# Patient Record
Sex: Female | Born: 1950 | Race: Black or African American | Hispanic: No | Smoking: Never smoker
Health system: Southern US, Community
[De-identification: ages and names within clinical notes are randomized; demographics above are authoritative.]

## PROBLEM LIST (undated history)

## (undated) DIAGNOSIS — M1990 Unspecified osteoarthritis, unspecified site: Secondary | ICD-10-CM

## (undated) DIAGNOSIS — J189 Pneumonia, unspecified organism: Secondary | ICD-10-CM

## (undated) DIAGNOSIS — R51 Headache: Secondary | ICD-10-CM

## (undated) DIAGNOSIS — I10 Essential (primary) hypertension: Secondary | ICD-10-CM

## (undated) DIAGNOSIS — I890 Lymphedema, not elsewhere classified: Secondary | ICD-10-CM

## (undated) DIAGNOSIS — N390 Urinary tract infection, site not specified: Secondary | ICD-10-CM

## (undated) DIAGNOSIS — S83209A Unspecified tear of unspecified meniscus, current injury, unspecified knee, initial encounter: Secondary | ICD-10-CM

## (undated) DIAGNOSIS — R29898 Other symptoms and signs involving the musculoskeletal system: Secondary | ICD-10-CM

## (undated) DIAGNOSIS — D649 Anemia, unspecified: Secondary | ICD-10-CM

## (undated) HISTORY — PX: TUBAL LIGATION: SHX77

## (undated) HISTORY — PX: OTHER SURGICAL HISTORY: SHX169

---

## 1982-02-16 HISTORY — PX: OTHER SURGICAL HISTORY: SHX169

## 1983-02-17 HISTORY — PX: OTHER SURGICAL HISTORY: SHX169

## 1985-02-16 HISTORY — PX: OTHER SURGICAL HISTORY: SHX169

## 1995-02-17 HISTORY — PX: VAGINAL HYSTERECTOMY: SUR661

## 1997-06-22 ENCOUNTER — Encounter: Admission: RE | Admit: 1997-06-22 | Discharge: 1997-09-20 | Payer: Self-pay | Admitting: Internal Medicine

## 1998-02-16 HISTORY — PX: BILATERAL SALPINGOOPHORECTOMY: SHX1223

## 1998-11-13 ENCOUNTER — Ambulatory Visit: Admission: RE | Admit: 1998-11-13 | Discharge: 1998-11-13 | Payer: Self-pay | Admitting: Gynecologic Oncology

## 1999-01-01 ENCOUNTER — Ambulatory Visit (HOSPITAL_COMMUNITY): Admission: RE | Admit: 1999-01-01 | Discharge: 1999-01-01 | Payer: Self-pay | Admitting: Gynecologic Oncology

## 1999-01-01 ENCOUNTER — Encounter: Payer: Self-pay | Admitting: Gynecologic Oncology

## 1999-06-24 ENCOUNTER — Encounter: Admission: RE | Admit: 1999-06-24 | Discharge: 1999-06-24 | Payer: Self-pay | Admitting: Family Medicine

## 1999-06-24 ENCOUNTER — Encounter: Payer: Self-pay | Admitting: Family Medicine

## 2000-08-10 ENCOUNTER — Encounter: Payer: Self-pay | Admitting: Family Medicine

## 2000-08-10 ENCOUNTER — Encounter: Admission: RE | Admit: 2000-08-10 | Discharge: 2000-08-10 | Payer: Self-pay | Admitting: Family Medicine

## 2003-12-05 ENCOUNTER — Encounter: Admission: RE | Admit: 2003-12-05 | Discharge: 2004-01-02 | Payer: Self-pay | Admitting: Oncology

## 2004-05-07 ENCOUNTER — Ambulatory Visit: Payer: Self-pay | Admitting: Oncology

## 2004-06-17 ENCOUNTER — Ambulatory Visit: Payer: Self-pay | Admitting: Gastroenterology

## 2004-07-01 ENCOUNTER — Ambulatory Visit: Payer: Self-pay | Admitting: Gastroenterology

## 2004-07-01 ENCOUNTER — Encounter (INDEPENDENT_AMBULATORY_CARE_PROVIDER_SITE_OTHER): Payer: Self-pay | Admitting: *Deleted

## 2004-08-29 ENCOUNTER — Ambulatory Visit: Payer: Self-pay | Admitting: Oncology

## 2004-11-12 ENCOUNTER — Ambulatory Visit: Payer: Self-pay | Admitting: Oncology

## 2004-12-01 ENCOUNTER — Ambulatory Visit: Admission: RE | Admit: 2004-12-01 | Discharge: 2004-12-01 | Payer: Self-pay | Admitting: Oncology

## 2005-03-09 ENCOUNTER — Ambulatory Visit: Payer: Self-pay | Admitting: Oncology

## 2005-06-08 ENCOUNTER — Ambulatory Visit: Payer: Self-pay | Admitting: Oncology

## 2006-02-16 HISTORY — PX: OTHER SURGICAL HISTORY: SHX169

## 2006-05-03 ENCOUNTER — Ambulatory Visit: Payer: Self-pay | Admitting: Oncology

## 2007-11-17 HISTORY — PX: INGUINAL HERNIA REPAIR: SUR1180

## 2009-02-18 ENCOUNTER — Ambulatory Visit (HOSPITAL_BASED_OUTPATIENT_CLINIC_OR_DEPARTMENT_OTHER): Admission: RE | Admit: 2009-02-18 | Discharge: 2009-02-18 | Payer: Self-pay | Admitting: Orthopedic Surgery

## 2009-02-18 HISTORY — PX: OTHER SURGICAL HISTORY: SHX169

## 2009-06-24 ENCOUNTER — Encounter (INDEPENDENT_AMBULATORY_CARE_PROVIDER_SITE_OTHER): Payer: Self-pay | Admitting: *Deleted

## 2009-07-02 ENCOUNTER — Encounter (INDEPENDENT_AMBULATORY_CARE_PROVIDER_SITE_OTHER): Payer: Self-pay | Admitting: *Deleted

## 2010-03-20 NOTE — Letter (Signed)
Summary: Colonoscopy Letter  [facility]  [address] [place], [place] [address]   Phone: [phone]  Fax: [phone]      Jul 02, 2009 MRN: [id number]   [facility] [address] [place], [place]  [address]   Dear Ms. Jiggetts,   According to your medical record, it is time for you to schedule a Colonoscopy. The American Cancer Society recommends this procedure as a method to detect early colon cancer. Patients with a family history of colon cancer, or a personal history of colon polyps or inflammatory bowel disease are at increased risk.  This letter has beeen generated based on the recommendations made at the time of your procedure. If you feel that in your particular situation this may no longer apply, please contact our office.  Please call our office at [phone] to schedule this appointment or to update your records at your earliest convenience.  Thank you for cooperating with [place] to provide you with the very best care possible.   Sincerely,  Barbette Hair. Arlyce Dice, M.D.  [facility] Gastroenterology Division [phone]

## 2010-03-20 NOTE — Letter (Signed)
Summary: Colonoscopy Letter  [facility]  [address] [place], [place] [address]   Phone: [phone]  Fax: [phone]      Jun 24, 2009 MRN: [id number]   [facility] [address] [place], [place]  [address]   Dear Ms. Toothman,   According to your medical record, it is time for you to schedule a Colonoscopy. The American Cancer Society recommends this procedure as a method to detect early colon cancer. Patients with a family history of colon cancer, or a personal history of colon polyps or inflammatory bowel disease are at increased risk.  This letter has beeen generated based on the recommendations made at the time of your procedure. If you feel that in your particular situation this may no longer apply, please contact our office.  Please call our office at [phone] to schedule this appointment or to update your records at your earliest convenience.  Thank you for cooperating with [place] to provide you with the very best care possible.   Sincerely,  Barbette Hair. Arlyce Dice, M.D.  [facility] Gastroenterology Division [phone]

## 2010-05-04 LAB — POCT I-STAT 4, (NA,K, GLUC, HGB,HCT)
Glucose, Bld: 98 mg/dL (ref 70–99)
Potassium: 3.4 mEq/L — ABNORMAL LOW (ref 3.5–5.1)

## 2010-08-14 ENCOUNTER — Encounter (HOSPITAL_BASED_OUTPATIENT_CLINIC_OR_DEPARTMENT_OTHER)
Admission: RE | Admit: 2010-08-14 | Discharge: 2010-08-14 | Disposition: A | Discharge status: Home / Self Care | Payer: BC Managed Care – PPO | Source: Ambulatory Visit | Attending: Ophthalmology | Admitting: Ophthalmology

## 2010-08-14 LAB — POCT I-STAT, CHEM 8
BUN: 10 mg/dL (ref 6–23)
Creatinine, Ser: 1 mg/dL (ref 0.50–1.10)
Glucose, Bld: 90 mg/dL (ref 70–99)
Hemoglobin: 12.2 g/dL (ref 12.0–15.0)
Potassium: 3.1 mEq/L — ABNORMAL LOW (ref 3.5–5.1)

## 2010-08-15 ENCOUNTER — Ambulatory Visit (HOSPITAL_BASED_OUTPATIENT_CLINIC_OR_DEPARTMENT_OTHER)
Admission: RE | Admit: 2010-08-15 | Discharge: 2010-08-15 | Disposition: A | Discharge status: Home / Self Care | Payer: BC Managed Care – PPO | Source: Ambulatory Visit | Attending: Ophthalmology | Admitting: Ophthalmology

## 2010-08-15 DIAGNOSIS — H5016 Alternating exotropia with A pattern: Secondary | ICD-10-CM | POA: Insufficient documentation

## 2010-08-15 DIAGNOSIS — Z01810 Encounter for preprocedural cardiovascular examination: Secondary | ICD-10-CM | POA: Insufficient documentation

## 2010-08-15 DIAGNOSIS — E669 Obesity, unspecified: Secondary | ICD-10-CM | POA: Insufficient documentation

## 2010-08-15 DIAGNOSIS — I10 Essential (primary) hypertension: Secondary | ICD-10-CM | POA: Insufficient documentation

## 2010-08-15 DIAGNOSIS — Z01812 Encounter for preprocedural laboratory examination: Secondary | ICD-10-CM | POA: Insufficient documentation

## 2010-08-15 HISTORY — PX: OTHER SURGICAL HISTORY: SHX169

## 2010-09-22 ENCOUNTER — Telehealth: Payer: Self-pay | Admitting: *Deleted

## 2010-09-22 NOTE — Telephone Encounter (Signed)
L/M for pt to return call or call back to schedule her colonoscopy

## 2010-09-22 NOTE — Telephone Encounter (Signed)
Message copied by Marlowe Kays on Mon Sep 22, 2010  3:27 PM ------      Message from: Tawni Levy I      Created: Mon Sep 22, 2010  3:12 PM       Patient had her procedure already in [place] last year.

## 2010-09-22 NOTE — Telephone Encounter (Signed)
Patient had her procedure already in [place] last year.

## 2010-12-19 NOTE — Op Note (Signed)
  [name], Patricia Krueger              ACCOUNT NO.:  [id number]  MEDICAL RECORD NO.:  [id number]  LOCATION:                                 FACILITY:  PHYSICIAN:  Pasty Spillers. Yazid Pop, M.D. DATE OF BIRTH:  December 02, 1950  DATE OF PROCEDURE:  08/15/2010 DATE OF DISCHARGE:                              OPERATIVE REPORT   PREOPERATIVE DIAGNOSIS:  A "pattern exotropia."  POSTOPERATIVE DIAGNOSIS:  A "pattern exotropia."  PROCEDURE:  Lateral rectus muscle recession, 9.5 mm both eyes, with one half tendon width downshift both eyes.  SURGEON:  Pasty Spillers. Keeton Kassebaum, MD  ANESTHESIA:  General (laryngeal mask).  COMPLICATIONS:  None.  DESCRIPTION OF PROCEDURE:  After routine preoperative evaluation including informed consent, the patient was taken to the operating room where she was identified by me.  General anesthesia was induced without difficulty after placement of appropriate monitors.  The patient was prepped and draped in standard sterile fashion.  Lid speculum was placed in the right eye.  Through an inferotemporal fornix incision through conjunctivae and Tenon's fascia, the right lateral rectus muscle was engaged on a series of muscle hooks and cleared of its fascial attachments.  The tendon was secured with a double-arm 6-0 Vicryl suture, with a double-locking bite at each border of the muscle, 1 mm from the insertion.  The muscle was disinserted, was reattached to sclerae at a measured distance of 9.5 mm posterior to the original insertion, with each pole transposed one-half tendon width inferiorly, so the superior pole was placed directly posterior to the midpoint of original insertion, and the inferior pole one-tendon width inferior to the superior pole.  The suture ends were tied securely after the position of the muscle have been checked and found to be accurate.  Conjunctivae was closed with two 6-0 plain gut sutures.  The speculum was transferred to the left eye, an  identical procedure was performed, again effecting a 9.5-mm recession of lateral rectus muscle with one-half tendon width downshift.  TobraDex ointment was placed in each eye.  The patient was awakened without difficulty and taken to the recovery room in stable condition of having suffered no intraoperative or immediate postoperative complications.    Pasty Spillers. Maple Hudson, M.D.    Cheron Schaumann  D:  08/15/2010  T:  08/16/2010  Job:  [id number]  Electronically Signed by Verne Carrow M.D. on 12/19/2010 06:09:40 PM

## 2011-03-27 NOTE — H&P (Signed)
Patricia Krueger DOB: 08/14/1950  Chief Complaint: Right knee pain   History of Present Illness The patient is a 61 year old female who presents for a pre-op H&P in preparation for a right arthroscopic medial and lateral menisectomies on Tuesday Apr 07, 2011. The patient presented reporting right knee symptoms including pain which began 6 weeks ago. The patient describes the severity of the symptoms as moderate in severity. Symptoms are reported to be located in the right knee and include knee pain, tenderness and locking. Symptoms are exacerbated by motion at the knee and kneeling.She feels very insecure on her right knee walking about. MRI revealed torn medial and lateral menisci in the right knee.    Problem List/Past Medical Trigger finger (727.03). 06/09/2005 Anemia Blood Clot Diverticulitis Of Colon High blood pressure Irritable bowel syndrome Migraine Headache Oophorectomy. bilateral  Allergies No Known Drug Allergies. 02/21/2011   Family History Cancer. mother, father, sister, brother and child   Social History Alcohol use. current drinker; drinks wine; only occasionally per week Children. 2 Current work status. working full time Drug/Alcohol Rehab (Currently). no Drug/Alcohol Rehab (Previously). no Exercise. Exercises rarely; does other Illicit drug use. no Living situation. live alone Marital status. divorced Number of flights of stairs before winded. 1 Pain Contract. no Tobacco / smoke exposure. no Tobacco use. never smoker   Medication History Atenolol (50MG  Tablet, Oral) Active. Estradiol (2MG  Tablet, Oral) Active. Hydrochlorothiazide (25MG  Tablet, Oral) Active. Klor-Con M20 ( Tablet ER, Oral) Active.    Past Surgical History Breast Biopsy. left, multiple times Colon Polyp Removal - Colonoscopy Hysterectomy. complete (non-cancerous) Inguinal Hernia Repair. laparoscopic: right    Review of  Systems General:Present- Chills and Night Sweats. Not Present- Fever, Appetite Loss, Fatigue, Feeling sick, Weight Gain and Weight Loss. Skin:Present- Rash. Not Present- Itching, Skin Color Changes, Ulcer, Psoriasis and Change in Hair or Nails. HEENT:Present- Sensitivity to light and Ringing in the Ears. Not Present- Hearing problems and Nose Bleed. Neck:Not Present- Swollen Glands and Neck Mass. Cardiovascular:Present- Swelling of Extremities and Leg Cramps. Not Present- Shortness of Breath, Chest Pain and Palpitations. Gastrointestinal:Present- Abdominal Pain, Vomiting and Nausea. Not Present- Bloody Stool, Heartburn and Incontinence of Stool. Female Genitourinary:Present- Frequency and Incontinence. Not Present- Blood in Urine, Menstrual Irregularities and Nocturia. Musculoskeletal:Present- Muscle Weakness, Muscle Pain, Joint Stiffness, Joint Swelling and Joint Pain. Not Present- Back Pain. Neurological:Present- Tingling, Numbness and Headaches. Not Present- Burning, Tremor and Dizziness. Psychiatric:Not Present- Anxiety, Depression and Memory Loss. Endocrine:Not Present- Cold Intolerance, Heat Intolerance, Excessive hunger and Excessive Thirst. Hematology:Present- Easy Bruising. Not Present- Abnormal Bleeding, Anemia and Blood Clots.   Vitals 02/21/2011 9:38 AM Weight: 213 lb Height: 67 in Body Surface Area: 2.14 m Body Mass Index: 33.36 kg/m BP: 120/80 (Sitting, Left Arm, Standard)  Physical Exam GAIT: She ambulates without support. The knee is popping and locking. She feels very insecure with this knee, especially going up and down steps. She can not completely extend her right knee. She flexes her knee with pain but this goes back to about 100 degrees only due to her pain. She has a mild knee effusion. Collateral and cruciate ligaments are stable. Popliteal space is fine. She has a good dorsalis pedis pulse. Calf is soft and nontender. No phlebitis. No hip  pain.The opposite left lower extremity she has lymphedema secondary to lymph node issues in her left groin in the past. Lungs clear. Heart: Normal sinus rhythm and no murmurs.Abdomen soft and nontender. Neuro grossly intact.    RADIOGRAPHS: AP of  both knees and lateral of the right knee were normal.   Assessment & Plan Acute Medial Meniscal Tear (836.0) Arthroscopic right medial and lateral menisectomies   Dimitri Ped, PA-C

## 2011-03-31 ENCOUNTER — Encounter (HOSPITAL_BASED_OUTPATIENT_CLINIC_OR_DEPARTMENT_OTHER): Payer: Self-pay | Admitting: *Deleted

## 2011-04-01 ENCOUNTER — Encounter (HOSPITAL_BASED_OUTPATIENT_CLINIC_OR_DEPARTMENT_OTHER): Payer: Self-pay | Admitting: *Deleted

## 2011-04-01 NOTE — Progress Notes (Signed)
NPO AFTER MN. ARRIVES AT 1030. NEEDS ISTAT AND EKG. 

## 2011-04-07 ENCOUNTER — Encounter (HOSPITAL_BASED_OUTPATIENT_CLINIC_OR_DEPARTMENT_OTHER): Payer: Self-pay | Admitting: Anesthesiology

## 2011-04-07 ENCOUNTER — Other Ambulatory Visit: Payer: Self-pay

## 2011-04-07 ENCOUNTER — Ambulatory Visit (HOSPITAL_BASED_OUTPATIENT_CLINIC_OR_DEPARTMENT_OTHER)
Admission: RE | Admit: 2011-04-07 | Discharge: 2011-04-07 | Disposition: A | Discharge status: Home / Self Care | Payer: BC Managed Care – PPO | Source: Ambulatory Visit | Attending: Orthopedic Surgery | Admitting: Orthopedic Surgery

## 2011-04-07 ENCOUNTER — Encounter (HOSPITAL_BASED_OUTPATIENT_CLINIC_OR_DEPARTMENT_OTHER)
Admission: RE | Disposition: A | Discharge status: Home / Self Care | Payer: Self-pay | Source: Ambulatory Visit | Attending: Orthopedic Surgery

## 2011-04-07 ENCOUNTER — Ambulatory Visit (HOSPITAL_BASED_OUTPATIENT_CLINIC_OR_DEPARTMENT_OTHER): Payer: BC Managed Care – PPO | Admitting: Anesthesiology

## 2011-04-07 ENCOUNTER — Encounter (HOSPITAL_BASED_OUTPATIENT_CLINIC_OR_DEPARTMENT_OTHER): Payer: Self-pay | Admitting: *Deleted

## 2011-04-07 DIAGNOSIS — M23329 Other meniscus derangements, posterior horn of medial meniscus, unspecified knee: Secondary | ICD-10-CM

## 2011-04-07 DIAGNOSIS — X58XXXA Exposure to other specified factors, initial encounter: Secondary | ICD-10-CM | POA: Insufficient documentation

## 2011-04-07 DIAGNOSIS — M2240 Chondromalacia patellae, unspecified knee: Secondary | ICD-10-CM | POA: Insufficient documentation

## 2011-04-07 DIAGNOSIS — IMO0002 Reserved for concepts with insufficient information to code with codable children: Secondary | ICD-10-CM | POA: Insufficient documentation

## 2011-04-07 DIAGNOSIS — M1710 Unilateral primary osteoarthritis, unspecified knee: Secondary | ICD-10-CM | POA: Insufficient documentation

## 2011-04-07 HISTORY — DX: Essential (primary) hypertension: I10

## 2011-04-07 HISTORY — DX: Headache: R51

## 2011-04-07 HISTORY — DX: Unspecified tear of unspecified meniscus, current injury, unspecified knee, initial encounter: S83.209A

## 2011-04-07 HISTORY — DX: Lymphedema, not elsewhere classified: I89.0

## 2011-04-07 HISTORY — DX: Other symptoms and signs involving the musculoskeletal system: R29.898

## 2011-04-07 HISTORY — PX: CHONDROPLASTY: SHX5177

## 2011-04-07 LAB — POCT I-STAT 4, (NA,K, GLUC, HGB,HCT)
Glucose, Bld: 89 mg/dL (ref 70–99)
HCT: 41 % (ref 36.0–46.0)
Hemoglobin: 13.9 g/dL (ref 12.0–15.0)
Sodium: 141 mEq/L (ref 135–145)

## 2011-04-07 SURGERY — ARTHROSCOPY, KNEE, WITH MEDIAL MENISCECTOMY
Anesthesia: General | Site: Knee | Laterality: Right | Wound class: Clean

## 2011-04-07 MED ORDER — BUPIVACAINE-EPINEPHRINE 0.25% -1:200000 IJ SOLN
INTRAMUSCULAR | Status: DC | PRN
  Administered 2011-04-07: 30 mL

## 2011-04-07 MED ORDER — OXYCODONE-ACETAMINOPHEN 5-325 MG PO TABS
2.0000 | ORAL_TABLET | ORAL | Status: DC | PRN
  Administered 2011-04-07: 2 via ORAL

## 2011-04-07 MED ORDER — SODIUM CHLORIDE 0.9 % IR SOLN
Status: DC | PRN
  Administered 2011-04-07: 9000 mL

## 2011-04-07 MED ORDER — LACTATED RINGERS IV SOLN: INTRAVENOUS | Status: DC

## 2011-04-07 MED ORDER — CHLORHEXIDINE GLUCONATE 4 % EX LIQD: 60.0000 mL | Freq: Once | CUTANEOUS | Status: DC

## 2011-04-07 MED ORDER — FENTANYL CITRATE 0.05 MG/ML IJ SOLN
25.0000 ug | INTRAMUSCULAR | Status: DC | PRN
  Administered 2011-04-07: 50 ug via INTRAVENOUS

## 2011-04-07 MED ORDER — ONDANSETRON HCL 4 MG/2ML IJ SOLN
INTRAMUSCULAR | Status: DC | PRN
  Administered 2011-04-07: 4 mg via INTRAVENOUS

## 2011-04-07 MED ORDER — BACITRACIN-NEOMYCIN-POLYMYXIN 400-5-5000 EX OINT
TOPICAL_OINTMENT | CUTANEOUS | Status: DC | PRN
  Administered 2011-04-07: 1 via TOPICAL

## 2011-04-07 MED ORDER — PROPOFOL 10 MG/ML IV EMUL
INTRAVENOUS | Status: DC | PRN
  Administered 2011-04-07: 250 mg via INTRAVENOUS

## 2011-04-07 MED ORDER — CEFAZOLIN SODIUM-DEXTROSE 2-3 GM-% IV SOLR
2.0000 g | INTRAVENOUS | Status: AC
  Administered 2011-04-07: 2 g via INTRAVENOUS

## 2011-04-07 MED ORDER — MEPERIDINE HCL 25 MG/ML IJ SOLN: 6.2500 mg | INTRAMUSCULAR | Status: DC | PRN

## 2011-04-07 MED ORDER — CEFAZOLIN SODIUM 1-5 GM-% IV SOLN: 1.0000 g | INTRAVENOUS | Status: DC

## 2011-04-07 MED ORDER — LACTATED RINGERS IV SOLN
INTRAVENOUS | Status: DC
  Administered 2011-04-07 (×2): via INTRAVENOUS

## 2011-04-07 MED ORDER — DEXAMETHASONE SODIUM PHOSPHATE 4 MG/ML IJ SOLN
INTRAMUSCULAR | Status: DC | PRN
  Administered 2011-04-07: 10 mg via INTRAVENOUS

## 2011-04-07 MED ORDER — LIDOCAINE HCL (CARDIAC) 20 MG/ML IV SOLN
INTRAVENOUS | Status: DC | PRN
  Administered 2011-04-07: 80 mg via INTRAVENOUS

## 2011-04-07 MED ORDER — PROMETHAZINE HCL 25 MG/ML IJ SOLN
6.2500 mg | INTRAMUSCULAR | Status: DC | PRN
  Administered 2011-04-07: 6.25 mg via INTRAVENOUS

## 2011-04-07 MED ORDER — FENTANYL CITRATE 0.05 MG/ML IJ SOLN
INTRAMUSCULAR | Status: DC | PRN
  Administered 2011-04-07 (×2): 25 ug via INTRAVENOUS
  Administered 2011-04-07: 50 ug via INTRAVENOUS
  Administered 2011-04-07 (×2): 25 ug via INTRAVENOUS
  Administered 2011-04-07: 50 ug via INTRAVENOUS
  Administered 2011-04-07: 25 ug via INTRAVENOUS

## 2011-04-07 SURGICAL SUPPLY — 46 items
BANDAGE ELASTIC 4 VELCRO ST LF (GAUZE/BANDAGES/DRESSINGS) ×3 IMPLANT
BANDAGE ELASTIC 6 VELCRO ST LF (GAUZE/BANDAGES/DRESSINGS) ×3 IMPLANT
BLADE CUDA 5.5 (BLADE) IMPLANT
BLADE CUTTER GATOR 3.5 (BLADE) IMPLANT
BLADE CUTTER MENIS 5.5 (BLADE) IMPLANT
BLADE GREAT WHITE 4.2 (BLADE) ×3 IMPLANT
BLADE SURG 15 STRL LF DISP TIS (BLADE) IMPLANT
BLADE SURG 15 STRL SS (BLADE)
BNDG COHESIVE 6X5 TAN NS LF (GAUZE/BANDAGES/DRESSINGS) ×3 IMPLANT
CANISTER SUCT LVC 12 LTR MEDI- (MISCELLANEOUS) ×3 IMPLANT
CANISTER SUCTION 2500CC (MISCELLANEOUS) ×3 IMPLANT
CLOTH BEACON ORANGE TIMEOUT ST (SAFETY) ×3 IMPLANT
DRAPE ARTHROSCOPY W/POUCH 114 (DRAPES) ×3 IMPLANT
DRAPE LG THREE QUARTER DISP (DRAPES) ×3 IMPLANT
DRSG EMULSION OIL 3X3 NADH (GAUZE/BANDAGES/DRESSINGS) ×3 IMPLANT
DRSG PAD ABDOMINAL 8X10 ST (GAUZE/BANDAGES/DRESSINGS) ×3 IMPLANT
DURAPREP 26ML APPLICATOR (WOUND CARE) ×3 IMPLANT
ELECT MENISCUS 165MM 90D (ELECTRODE) IMPLANT
ELECT REM PT RETURN 9FT ADLT (ELECTROSURGICAL)
ELECTRODE REM PT RTRN 9FT ADLT (ELECTROSURGICAL) IMPLANT
GELFOAM SMALL 12 7MM 315 3 (MISCELLANEOUS) IMPLANT
GLOVE BIOGEL PI IND STRL 6.5 (GLOVE) ×2 IMPLANT
GLOVE BIOGEL PI INDICATOR 6.5 (GLOVE) ×1
GLOVE ECLIPSE 6.5 STRL STRAW (GLOVE) ×3 IMPLANT
GLOVE ECLIPSE 8.0 STRL XLNG CF (GLOVE) ×3 IMPLANT
GLOVE INDICATOR 8.5 STRL (GLOVE) ×6 IMPLANT
GLOVE SURG ORTHO 6.0 STRL STRW (GLOVE) ×3 IMPLANT
GOWN SURGICAL LARGE (GOWNS) ×3 IMPLANT
GOWN W/COTTON TOWEL STD LRG (GOWNS) ×3 IMPLANT
GOWN XL W/COTTON TOWEL STD (GOWNS) ×3 IMPLANT
IV NS IRRIG 3000ML ARTHROMATIC (IV SOLUTION) ×9 IMPLANT
KNEE WRAP E Z 3 GEL PACK (MISCELLANEOUS) ×3 IMPLANT
MINI VAC (SURGICAL WAND) IMPLANT
PACK ARTHROSCOPY DSU (CUSTOM PROCEDURE TRAY) ×3 IMPLANT
PACK BASIN DAY SURGERY FS (CUSTOM PROCEDURE TRAY) ×3 IMPLANT
PADDING CAST COTTON 6X4 STRL (CAST SUPPLIES) ×3 IMPLANT
PADDING WEBRIL 6 STERILE (GAUZE/BANDAGES/DRESSINGS) ×3 IMPLANT
PENCIL BUTTON HOLSTER BLD 10FT (ELECTRODE) IMPLANT
SET ARTHROSCOPY TUBING (MISCELLANEOUS) ×3
SET ARTHROSCOPY TUBING LN (MISCELLANEOUS) ×2 IMPLANT
SPONGE GAUZE 4X4 12PLY (GAUZE/BANDAGES/DRESSINGS) ×3 IMPLANT
SUT ETHILON 3 0 PS 1 (SUTURE) ×3 IMPLANT
SYR CONTROL 10ML LL (SYRINGE) IMPLANT
TOWEL OR 17X24 6PK STRL BLUE (TOWEL DISPOSABLE) ×3 IMPLANT
WAND 90 DEG TURBOVAC W/CORD (SURGICAL WAND) ×3 IMPLANT
WATER STERILE IRR 500ML POUR (IV SOLUTION) ×3 IMPLANT

## 2011-04-07 NOTE — Anesthesia Postprocedure Evaluation (Signed)
  Anesthesia Post-op Note  Patient: Patricia Krueger  Procedure(s) Performed: Procedure(s) (LRB): KNEE ARTHROSCOPY WITH MEDIAL MENISECTOMY (Right) CHONDROPLASTY ()  Patient Location: PACU  Anesthesia Type: General  Level of Consciousness: awake and alert   Airway and Oxygen Therapy: Patient Spontanous Breathing  Post-op Pain: mild  Post-op Assessment: Post-op Vital signs reviewed, Patient's Cardiovascular Status Stable, Respiratory Function Stable, Patent Airway and No signs of Nausea or vomiting  Post-op Vital Signs: stable  Complications: No apparent anesthesia complications

## 2011-04-07 NOTE — Brief Op Note (Signed)
04/07/2011  2:02 PM  PATIENT:  Patricia Krueger  61 y.o. female  PRE-OPERATIVE DIAGNOSIS:  RIGHT KNEE MENISCAL TEAR  POST-OPERATIVE DIAGNOSIS:  RIGHT KNEE MENISCAL TEAR  PROCEDURE:  Procedure(s) (LRB): KNEE ARTHROSCOPY WITH MEDIAL MENISECTOMY (Right) CHONDROPLASTY ()  SURGEON:  Surgeon(s) and Role:    * Jacki Cones, MD - Primary  PHYSICIAN ASSISTANT:   ASSISTANTS: Nurse   ANESTHESIA:   general  EBL:  Total I/O In: 1200 [I.V.:1200] Out: -   BLOOD ADMINISTERED:none  DRAINS: none   LOCAL MEDICATIONS USED:  MARCAINE 30cc of 0.25% marcaine with Epinephrine    SPECIMEN:  No Specimen  DISPOSITION OF SPECIMEN:  N/A  COUNTS:  YES  TOURNIQUET:  * No tourniquets in log *  DICTATION: .Other Dictation: Dictation Number [id number]  PLAN OF CARE: Discharge to home after PACU  PATIENT DISPOSITION:  PACU - hemodynamically stable.   Delay start of Pharmacological VTE agent (>24hrs) due to surgical blood loss or risk of bleeding: yes

## 2011-04-07 NOTE — Anesthesia Preprocedure Evaluation (Addendum)
Anesthesia Evaluation  Patient identified by MRN, date of birth, ID band Patient awake    Reviewed: Allergy & Precautions, H&P , NPO status , Patient's Chart, lab work & pertinent test results  Airway Mallampati: II TM Distance: >3 FB Neck ROM: full    Dental No notable dental hx.    Pulmonary neg pulmonary ROS,  clear to auscultation  Pulmonary exam normal       Cardiovascular Exercise Tolerance: Good hypertension, Pt. on medications neg cardio ROS regular Normal    Neuro/Psych Negative Neurological ROS  Negative Psych ROS   GI/Hepatic negative GI ROS, Neg liver ROS,   Endo/Other  Negative Endocrine ROS  Renal/GU negative Renal ROS  Genitourinary negative   Musculoskeletal   Abdominal   Peds  Hematology negative hematology ROS (+)   Anesthesia Other Findings   Reproductive/Obstetrics negative OB ROS                           Anesthesia Physical Anesthesia Plan  ASA: II  Anesthesia Plan: General   Post-op Pain Management:    Induction:   Airway Management Planned: LMA  Additional Equipment:   Intra-op Plan:   Post-operative Plan:   Informed Consent: I have reviewed the patients History and Physical, chart, labs and discussed the procedure including the risks, benefits and alternatives for the proposed anesthesia with the patient or authorized representative who has indicated his/her understanding and acceptance.   Dental Advisory Given  Plan Discussed with: CRNA  Anesthesia Plan Comments:        Anesthesia Quick Evaluation

## 2011-04-07 NOTE — H&P (View-Only) (Signed)
NPO AFTER MN. ARRIVES AT 1030. NEEDS ISTAT AND EKG. 

## 2011-04-07 NOTE — Interval H&P Note (Signed)
History and Physical Interval Note:  04/07/2011 1:02 PM  Patricia Krueger  has presented today for surgery, with the diagnosis of RIGHT KNEE MENISCAL TEAR  The various methods of treatment have been discussed with the patient and family. After consideration of risks, benefits and other options for treatment, the patient has consented to  Procedure(s) (LRB): KNEE ARTHROSCOPY WITH MEDIAL MENISECTOMY (Right) as a surgical intervention .  The patients' history has been reviewed, patient examined, no change in status, stable for surgery.  I have reviewed the patients' chart and labs.  Questions were answered to the patient's satisfaction.     Henry Demeritt A

## 2011-04-07 NOTE — Anesthesia Procedure Notes (Signed)
Procedure Name: LMA Insertion Date/Time: 04/07/2011 1:19 PM Performed by: Lorrin Jackson Pre-anesthesia Checklist: Patient identified, Emergency Drugs available, Suction available and Patient being monitored Patient Re-evaluated:Patient Re-evaluated prior to inductionOxygen Delivery Method: Circle System Utilized Preoxygenation: Pre-oxygenation with 100% oxygen Intubation Type: IV induction Ventilation: Mask ventilation without difficulty LMA: LMA with gastric port inserted LMA Size: 4.0 Number of attempts: 1 Placement Confirmation: positive ETCO2 Tube secured with: Tape Dental Injury: Teeth and Oropharynx as per pre-operative assessment

## 2011-04-07 NOTE — Discharge Instructions (Signed)
Schedule appointment to see me in 10-12 days post op. Call the office at [phone] to make appointment.  Ice to the knee x 24 hours Aspirin 325mg twice a day started the day of surgery and for 2 weeks Crutches full weight bearing until you have control of your leg without them Remove all dressings in 48 hours and apply band-aids to each incision. Then you may shower. Any temperatures over 100 or severe calf pain, call the office 

## 2011-04-07 NOTE — Transfer of Care (Signed)
Immediate Anesthesia Transfer of Care Note  Patient: Patricia Krueger  Procedure(s) Performed: Procedure(s) (LRB): KNEE ARTHROSCOPY WITH MEDIAL MENISECTOMY (Right) CHONDROPLASTY ()  Patient Location: Patient transported to PACU with oxygen via face mask at 4 Liters / Min  Anesthesia Type: General  Level of Consciousness: awake and alert   Airway & Oxygen Therapy: Patient Spontanous Breathing and Patient connected to face mask oxygen  Post-op Assessment: Report given to PACU RN and Post -op Vital signs reviewed and stable  Post vital signs: Reviewed and stable  Dentition: Teeth and oropharynx remain in pre-op condition  Complications: No apparent anesthesia complications

## 2011-04-08 ENCOUNTER — Encounter (HOSPITAL_BASED_OUTPATIENT_CLINIC_OR_DEPARTMENT_OTHER): Payer: Self-pay | Admitting: Orthopedic Surgery

## 2011-04-08 NOTE — Op Note (Signed)
[name], Patricia Krueger              ACCOUNT NO.:  [id number]  MEDICAL RECORD NO.:  [id number]  LOCATION:                               FACILITY:  [facility]  PHYSICIAN:  Georges Lynch. Andjela Wickes, M.D.DATE OF BIRTH:  1950-10-30  DATE OF PROCEDURE:  04/07/2011 DATE OF DISCHARGE:                              OPERATIVE REPORT   PREOPERATIVE DIAGNOSES: 1. Early degenerative arthritis of the right knee. 2. Torn medial meniscus, posterior horn, right knee.  POSTOPERATIVE DIAGNOSES: 1. Early degenerative arthritis of the right knee. 2. Torn medial meniscus, posterior horn, right knee.  OPERATION: 1. Diagnostic arthroscopy, right knee. 2. Medial meniscectomy, the posterior horn medial meniscus, right     knee. 3. Abrasion chondroplasty, tibial plateau, right knee. 4. Abrasion chondroplasty, patella, right knee.  PROCEDURE IN DETAIL:  Under general anesthesia, routine orthopedic prepping and draping of the right lower extremity was carried out.  The patient's right leg was placed in a knee holder.  At this particular time, the appropriate time-out was carried out.  I also marked the appropriate right leg in the holding area.  At this particular time, she had 2 g of IV Ancef.  Note, the surgery was delayed because of the room was not ready and there was a delay from the time she went into the operating room.  Apparently, there was another surgeon ahead of me who delayed the case.  Anyway, a small punctate incision was made in suprapatellar pouch, inflow cannula was then inserted and the knee was distended with saline.  Another small punctate incision was made in the anterolateral joint.  The arthroscope was entered from the lateral approach and a complete diagnostic arthroscopy was carried out.  At this particular time, I went up in the suprapatellar pouch.  She had chondromalacia of the patella.  I introduced the ArthroCare and did a partial abrasion chondroplasty.  I also did a synovectomy.   Following that, I went down the lateral joint.  The lateral joint was clean. There was no meniscectomy necessary.  No significant arthritis. Cruciates were intact.  I went over the medial joint, she had obvious chondromalacia of the tibial plateau.  I did an abrasion chondroplasty of the medial tibial plateau.  Also, she had a large tear of the posterior horn medial meniscus.  I did a partial medial meniscectomy. Following that, we reinspected the menisci and remaining portions of the medial meniscus and the lateral meniscus was intact.  Thoroughly irrigated out the knee, there were no other abnormalities noted.  I removed all the fluid, closed all 3 punctate incisions with 3-0 nylon suture.  I injected 30 mL of 0.25% Marcaine, epinephrine into the knee joint and a sterile Neosporin dressing was applied.  PLAN: 1. Postop, she will be on aspirin 325 mg b.i.d. starting today and     b.i.d. for 2 weeks as an anticoagulant. 2. She will be on Percocet 10/650 mg 1 every 4 hours p.r.n. for pain. 3. She will be on crutches, partial to full weightbearing as     tolerated. 4. She will see me in the office in 10-12 days or prior to that if she     has any  problem.          ______________________________ Georges Lynch Darrelyn Hillock, M.D.     RAG/MEDQ  D:  04/07/2011  T:  04/08/2011  Job:  [id number]

## 2012-03-02 ENCOUNTER — Inpatient Hospital Stay: Admit: 2012-03-02 | Payer: Self-pay | Admitting: Orthopedic Surgery

## 2012-03-02 SURGERY — ARTHROPLASTY, KNEE, TOTAL
Anesthesia: General | Site: Knee | Laterality: Left

## 2012-03-22 NOTE — Patient Instructions (Signed)
Patricia Krueger  03/22/2012   Your procedure is scheduled on:  03/30/12   Report to [facility] at   0630 AM.  Call this number if you have problems the morning of surgery: [phone]   Remember:   Do not eat food or drink liquids after midnight.   Take these medicines the morning of surgery with A SIP OF WATER:    Do not wear jewelry, make-up or nail polish.  Do not wear lotions, powders, or perfumes.   Do not shave 48 hours prior to surgery.   Do not bring valuables to the hospital.  Contacts, dentures or bridgework may not be worn into surgery.  Leave suitcase in the car. After surgery it may be brought to your room.  For patients admitted to the hospital, checkout time is 11:00 AM the day of  discharge.       SEE CHG INSTRUCTION SHEET    Please read over the following fact sheets that you were given: MRSA Information, coughing and deep breathing exercises, leg exercises, Blood Transfusion Fact Sheet, Incentive Spirometry Fact sheet                Failure to comply with these instructions may result in cancellation of your surgery.                Patient Signature ____________________________              Nurse Signature _____________________________

## 2012-03-23 ENCOUNTER — Ambulatory Visit (HOSPITAL_COMMUNITY)
Admission: RE | Admit: 2012-03-23 | Discharge: 2012-03-23 | Disposition: A | Discharge status: Home / Self Care | Payer: BC Managed Care – PPO | Source: Ambulatory Visit | Attending: Surgical | Admitting: Surgical

## 2012-03-23 ENCOUNTER — Encounter (HOSPITAL_COMMUNITY): Payer: Self-pay | Admitting: Pharmacy Technician

## 2012-03-23 ENCOUNTER — Encounter (HOSPITAL_COMMUNITY)
Admission: RE | Admit: 2012-03-23 | Discharge: 2012-03-23 | Disposition: A | Discharge status: Home / Self Care | Payer: BC Managed Care – PPO | Source: Ambulatory Visit | Attending: Orthopedic Surgery | Admitting: Orthopedic Surgery

## 2012-03-23 ENCOUNTER — Encounter (HOSPITAL_COMMUNITY): Payer: Self-pay

## 2012-03-23 DIAGNOSIS — Z01812 Encounter for preprocedural laboratory examination: Secondary | ICD-10-CM | POA: Insufficient documentation

## 2012-03-23 DIAGNOSIS — Z01810 Encounter for preprocedural cardiovascular examination: Secondary | ICD-10-CM | POA: Insufficient documentation

## 2012-03-23 DIAGNOSIS — Z01818 Encounter for other preprocedural examination: Secondary | ICD-10-CM | POA: Insufficient documentation

## 2012-03-23 DIAGNOSIS — R9431 Abnormal electrocardiogram [ECG] [EKG]: Secondary | ICD-10-CM | POA: Insufficient documentation

## 2012-03-23 HISTORY — DX: Anemia, unspecified: D64.9

## 2012-03-23 HISTORY — DX: Unspecified osteoarthritis, unspecified site: M19.90

## 2012-03-23 HISTORY — DX: Pneumonia, unspecified organism: J18.9

## 2012-03-23 HISTORY — DX: Lymphedema, not elsewhere classified: I89.0

## 2012-03-23 HISTORY — DX: Urinary tract infection, site not specified: N39.0

## 2012-03-23 LAB — COMPREHENSIVE METABOLIC PANEL
ALT: 7 U/L (ref 0–35)
AST: 14 U/L (ref 0–37)
Albumin: 3.4 g/dL — ABNORMAL LOW (ref 3.5–5.2)
Alkaline Phosphatase: 45 U/L (ref 39–117)
BUN: 12 mg/dL (ref 6–23)
CO2: 26 mEq/L (ref 19–32)
Calcium: 9.4 mg/dL (ref 8.4–10.5)
Chloride: 101 mEq/L (ref 96–112)
Creatinine, Ser: 0.85 mg/dL (ref 0.50–1.10)
GFR calc Af Amer: 85 mL/min — ABNORMAL LOW (ref >90)
GFR calc non Af Amer: 74 mL/min — ABNORMAL LOW (ref >90)
Glucose, Bld: 90 mg/dL (ref 70–99)
Potassium: 3.4 mEq/L — ABNORMAL LOW (ref 3.5–5.1)
Sodium: 136 mEq/L (ref 135–145)
Total Bilirubin: 0.2 mg/dL — ABNORMAL LOW (ref 0.3–1.2)
Total Protein: 7.2 g/dL (ref 6.0–8.3)

## 2012-03-23 LAB — URINALYSIS, ROUTINE W REFLEX MICROSCOPIC
Bilirubin Urine: NEGATIVE
Glucose, UA: NEGATIVE mg/dL
Ketones, ur: NEGATIVE mg/dL
Leukocytes, UA: NEGATIVE
Nitrite: NEGATIVE
Protein, ur: NEGATIVE mg/dL
Specific Gravity, Urine: 1.015 (ref 1.005–1.030)
Urobilinogen, UA: 0.2 mg/dL (ref 0.0–1.0)
pH: 5 (ref 5.0–8.0)

## 2012-03-23 LAB — PROTIME-INR
INR: 0.93 (ref 0.00–1.49)
Prothrombin Time: 12.4 seconds (ref 11.6–15.2)

## 2012-03-23 LAB — CBC
HCT: 37.3 % (ref 36.0–46.0)
Hemoglobin: 12.6 g/dL (ref 12.0–15.0)
RBC: 4.15 MIL/uL (ref 3.87–5.11)
WBC: 4.6 10*3/uL (ref 4.0–10.5)

## 2012-03-23 LAB — URINE MICROSCOPIC-ADD ON

## 2012-03-23 LAB — SURGICAL PCR SCREEN: Staphylococcus aureus: NEGATIVE

## 2012-03-23 LAB — APTT: aPTT: 29 seconds (ref 24–37)

## 2012-03-23 NOTE — Progress Notes (Signed)
03/23/12 1327  OBSTRUCTIVE SLEEP APNEA  Have you ever been diagnosed with sleep apnea through a sleep study? No  Do you snore loudly (loud enough to be heard through closed doors)?  0  Do you often feel tired, fatigued, or sleepy during the daytime? 1  Has anyone observed you stop breathing during your sleep? 0  Do you have, or are you being treated for high blood pressure? 1  BMI more than 35 kg/m2? 1  Age over 60 years old? 1  Neck circumference greater than 40 cm/18 inches? 0  Gender: 0  Obstructive Sleep Apnea Score 4

## 2012-03-23 NOTE — Progress Notes (Signed)
Urinalysis with micro results faxed via EPIC to Dr Gioffre.  

## 2012-03-24 NOTE — H&P (Signed)
TOTAL KNEE ADMISSION H&P  Patient is being admitted for right total knee arthroplasty.  Subjective:  Chief Complaint:right knee pain.  HPI: Patricia Krueger, 62 y.o. female, has a history of pain and functional disability in the right knee due to arthritis and has failed non-surgical conservative treatments for greater than 12 weeks to includeNSAID's and/or analgesics, corticosteriod injections, viscosupplementation injections and activity modification.  Onset of symptoms was gradual, starting 1 years ago with gradually worsening course since that time. The patient noted prior procedures on the knee to include  arthroscopy and menisectomy on the right knee(s).  Patient currently rates pain in the right knee(s) at 7 out of 10 with activity. Patient has night pain, worsening of pain with activity and weight bearing, pain that interferes with activities of daily living, pain with passive range of motion, crepitus and joint swelling.  Patient has evidence of subchondral cysts and joint space narrowing by imaging studies.  There is no active infection.   Past Medical History  Diagnosis Date  . Acute meniscal tear of knee RIGHT  . Hypertension   . Left leg weakness DUE TO LYMPHEMEMA    WEARS BRACE  . Lymphedema of left leg   . Pneumonia     hx of pneumonia x 2   . Lymphedema     left leg - due to lymphnodes removed from groin in 1987  . UTI (lower urinary tract infection)   . Headache     hx of migraines   . Arthritis   . Anemia     Past Surgical History  Procedure Date  . Inguinal hernia repair OCT 2009    RIGHT  . Colectomy for perferation 2008  . Bilateral salpingoophorectomy 2000  . Vaginal hysterectomy 1997  . Tubal ligation YRS AGO  . Left inguinal lymph node removed  1987    LYMPHEDEMA  . Cyst removed left middle finger 1985  . Benign breast tumors removed 1984  . Lateral rectus muscle recession, both eyes 08-15-2010  . Expl. right thumb/ removal scarred portion of a1 pulley  02-18-2009  . Chondroplasty 04/07/2011    Procedure: CHONDROPLASTY;  Surgeon: Jacki Cones, MD;  Location: [facility];  Service: Orthopedics;;  chondroplasty of right medial tibial plateau and patella  . Blood clot removed      right arm    Current outpatient prescriptions acetaminophen (TYLENOL) 500 MG tablet, Take 500 mg by mouth every 6 (six) hours as needed. For pain, Disp: , Rfl: ;   aspirin EC 81 MG tablet, Take 81 mg by mouth every morning., Disp: , Rfl: ;   atenolol (TENORMIN) 50 MG tablet, Take 50 mg by mouth every evening., Disp: , Rfl: ;   celecoxib (CELEBREX) 200 MG capsule, Take 200 mg by mouth every morning., Disp: , Rfl:  estradiol (ESTRACE) 2 MG tablet, Take 2 mg by mouth daily., Disp: , Rfl: ;   hydrochlorothiazide (HYDRODIURIL) 25 MG tablet, Take 25 mg by mouth every morning. , Disp: , Rfl: ; methocarbamol (ROBAXIN) 500 MG tablet, Take 500 mg by mouth 3 (three) times daily., Disp: , Rfl: ;  Multiple Vitamin (MULTIVITAMIN) tablet, Take 1 tablet by mouth 3 (three) times a week., Disp: , Rfl:  OVER THE COUNTER MEDICATION, Gummy fiber - one daily  Patient takes [employer] brand, Disp: , Rfl: ;   potassium chloride (KLOR-CON) 20 MEQ packet, Take 20 mEq by mouth 2 (two) times daily., Disp: , Rfl: ;   trolamine salicylate (ASPERCREME) 10 %  cream, Apply 1 application topically daily as needed. For neck and shoulder pain, Disp: , Rfl:   Allergies  Allergen Reactions  . Morphine And Related Itching    History  Substance Use Topics  . Smoking status: Never Smoker   . Smokeless tobacco: Never Used  . Alcohol Use: Yes     Comment: occasional    Family History Father deceased age 62 due to pancreatic cancer Mother deceased age 75 due cervical cancer Brother deceased age 60 due brain tumor   Review of Systems  Constitutional: Positive for diaphoresis. Negative for fever, chills, weight loss and malaise/fatigue.  HENT: Negative for hearing loss, ear pain,  nosebleeds, congestion, sore throat, neck pain, tinnitus and ear discharge.   Eyes: Negative.   Respiratory: Negative.  Negative for stridor.   Cardiovascular: Negative.   Gastrointestinal: Positive for heartburn, nausea and abdominal pain. Negative for vomiting, diarrhea, constipation, blood in stool and melena.  Genitourinary: Positive for frequency. Negative for dysuria, urgency, hematuria and flank pain.  Musculoskeletal: Positive for joint pain. Negative for myalgias, back pain and falls.       Right knee pain  Skin: Positive for itching. Negative for rash.  Neurological: Positive for headaches. Negative for weakness.  Endo/Heme/Allergies: Negative.   Psychiatric/Behavioral: Negative.     Objective:  Physical Exam  Constitutional: She is oriented to person, place, and time. She appears well-developed and well-nourished. No distress.  HENT:  Head: Normocephalic and atraumatic.  Right Ear: External ear normal.  Left Ear: External ear normal.  Nose: Nose normal.  Mouth/Throat: Oropharynx is clear and moist. No oropharyngeal exudate.  Eyes: Conjunctivae normal and EOM are normal.  Neck: Neck supple. No tracheal deviation present. No thyromegaly present.  Cardiovascular: Normal rate, regular rhythm, normal heart sounds and intact distal pulses.   No murmur heard. Respiratory: Effort normal and breath sounds normal. No respiratory distress. She has no wheezes. She exhibits no tenderness.  GI: Soft. Bowel sounds are normal. She exhibits no distension and no mass. There is no tenderness.  Musculoskeletal:       Right hip: Normal.       Left hip: Normal.       Right knee: She exhibits decreased range of motion and swelling. She exhibits no erythema. tenderness found. Medial joint line tenderness noted.       Left knee: Normal.       Left upper leg: She exhibits edema. She exhibits no tenderness.       Right lower leg: She exhibits no swelling.       Left lower leg: She exhibits  edema. She exhibits no tenderness.       Right foot: She exhibits no swelling.       Left foot: She exhibits swelling.  Lymphadenopathy:    She has no cervical adenopathy.  Neurological: She is alert and oriented to person, place, and time. She has normal strength and normal reflexes. No sensory deficit.  Skin: No rash noted. She is not diaphoretic. No erythema.  Psychiatric: She has a normal mood and affect. Her behavior is normal.    Vitals Pulse: 81 (Regular) BP: 158/84 (Sitting, Left Arm, Standard)   Estimated Body mass index is 33.89 kg/(m^2) as calculated from the following:   Height as of 04/07/11: 5\' 6" (1.676 m).   Weight as of 04/07/11: 210 lb(95.255 kg).   Imaging Review Plain radiographs demonstrate moderate degenerative joint disease of the right knee(s). The overall alignment ismild varus. The bone quality appears to  be good for age and reported activity level.  Assessment/Plan:  End stage arthritis, right knee   The patient history, physical examination, clinical judgment of the provider and imaging studies are consistent with end stage degenerative joint disease of the right knee(s) and total knee arthroplasty is deemed medically necessary. The treatment options including medical management, injection therapy arthroscopy and arthroplasty were discussed at length. The risks and benefits of total knee arthroplasty were presented and reviewed. The risks due to aseptic loosening, infection, stiffness, patella tracking problems, thromboembolic complications and other imponderables were discussed. The patient acknowledged the explanation, agreed to proceed with the plan and consent was signed. Patient is being admitted for inpatient treatment for surgery, pain control, PT, OT, prophylactic antibiotics, VTE prophylaxis, progressive ambulation and ADL's and discharge planning. The patient is planning to be discharged home with home health services     [place],  [place]

## 2012-03-30 ENCOUNTER — Inpatient Hospital Stay (HOSPITAL_COMMUNITY)
Admission: RE | Admit: 2012-03-30 | Discharge: 2012-04-02 | DRG: 209 | Disposition: A | Discharge status: Home Health | Payer: BC Managed Care – PPO | Source: Ambulatory Visit | Attending: Orthopedic Surgery | Admitting: Orthopedic Surgery

## 2012-03-30 ENCOUNTER — Encounter (HOSPITAL_COMMUNITY): Payer: Self-pay | Admitting: Anesthesiology

## 2012-03-30 ENCOUNTER — Ambulatory Visit (HOSPITAL_COMMUNITY): Payer: BC Managed Care – PPO

## 2012-03-30 ENCOUNTER — Ambulatory Visit (HOSPITAL_COMMUNITY): Payer: BC Managed Care – PPO | Admitting: Anesthesiology

## 2012-03-30 ENCOUNTER — Encounter (HOSPITAL_COMMUNITY)
Admission: RE | Disposition: A | Discharge status: Home Health | Payer: Self-pay | Source: Ambulatory Visit | Attending: Orthopedic Surgery

## 2012-03-30 ENCOUNTER — Encounter (HOSPITAL_COMMUNITY): Payer: Self-pay | Admitting: *Deleted

## 2012-03-30 DIAGNOSIS — Z96651 Presence of right artificial knee joint: Secondary | ICD-10-CM

## 2012-03-30 DIAGNOSIS — D62 Acute posthemorrhagic anemia: Secondary | ICD-10-CM | POA: Diagnosis present

## 2012-03-30 DIAGNOSIS — M1711 Unilateral primary osteoarthritis, right knee: Secondary | ICD-10-CM | POA: Diagnosis present

## 2012-03-30 DIAGNOSIS — I10 Essential (primary) hypertension: Secondary | ICD-10-CM | POA: Diagnosis present

## 2012-03-30 DIAGNOSIS — K5900 Constipation, unspecified: Secondary | ICD-10-CM | POA: Diagnosis not present

## 2012-03-30 DIAGNOSIS — M1710 Unilateral primary osteoarthritis, unspecified knee: Principal | ICD-10-CM | POA: Diagnosis present

## 2012-03-30 HISTORY — PX: TOTAL KNEE ARTHROPLASTY: SHX125

## 2012-03-30 LAB — TYPE AND SCREEN
ABO/RH(D): A POS
Antibody Screen: NEGATIVE

## 2012-03-30 LAB — ABO/RH: ABO/RH(D): A POS

## 2012-03-30 SURGERY — ARTHROPLASTY, KNEE, TOTAL
Anesthesia: General | Site: Knee | Laterality: Right | Wound class: Clean

## 2012-03-30 MED ORDER — METHOCARBAMOL 500 MG PO TABS
500.0000 mg | ORAL_TABLET | Freq: Four times a day (QID) | ORAL | Status: DC | PRN
  Administered 2012-03-31 – 2012-04-01 (×5): 500 mg via ORAL
  Filled 2012-03-30 (×5): qty 1

## 2012-03-30 MED ORDER — ONDANSETRON HCL 4 MG/2ML IJ SOLN
INTRAMUSCULAR | Status: DC | PRN
  Administered 2012-03-30: 4 mg via INTRAVENOUS

## 2012-03-30 MED ORDER — SODIUM CHLORIDE 0.9 % IJ SOLN
INTRAMUSCULAR | Status: DC | PRN
  Administered 2012-03-30: 20 mL

## 2012-03-30 MED ORDER — LACTATED RINGERS IV SOLN
INTRAVENOUS | Status: DC
Start: 2012-03-30 — End: 2012-03-30

## 2012-03-30 MED ORDER — ACETAMINOPHEN 650 MG RE SUPP: 650.0000 mg | Freq: Four times a day (QID) | RECTAL | Status: DC | PRN

## 2012-03-30 MED ORDER — HYDROMORPHONE HCL PF 1 MG/ML IJ SOLN
INTRAMUSCULAR | Status: AC
  Filled 2012-03-30: qty 1

## 2012-03-30 MED ORDER — CELECOXIB 200 MG PO CAPS
200.0000 mg | ORAL_CAPSULE | Freq: Two times a day (BID) | ORAL | Status: DC
  Administered 2012-03-30 – 2012-04-02 (×6): 200 mg via ORAL
  Filled 2012-03-30 (×8): qty 1

## 2012-03-30 MED ORDER — THROMBIN 5000 UNITS EX SOLR
CUTANEOUS | Status: DC | PRN
  Administered 2012-03-30: 5000 [IU] via TOPICAL

## 2012-03-30 MED ORDER — HYDROMORPHONE HCL PF 1 MG/ML IJ SOLN
INTRAMUSCULAR | Status: DC | PRN
  Administered 2012-03-30 (×2): 1 mg via INTRAVENOUS

## 2012-03-30 MED ORDER — POTASSIUM CHLORIDE CRYS ER 20 MEQ PO TBCR
20.0000 meq | EXTENDED_RELEASE_TABLET | Freq: Two times a day (BID) | ORAL | Status: DC
  Administered 2012-03-30 – 2012-04-02 (×6): 20 meq via ORAL
  Filled 2012-03-30 (×8): qty 1

## 2012-03-30 MED ORDER — METHOCARBAMOL 100 MG/ML IJ SOLN
500.0000 mg | Freq: Four times a day (QID) | INTRAMUSCULAR | Status: DC | PRN
  Administered 2012-03-31: 500 mg via INTRAVENOUS
  Filled 2012-03-30 (×3): qty 5

## 2012-03-30 MED ORDER — SODIUM CHLORIDE 0.9 % IR SOLN
Status: DC | PRN
  Administered 2012-03-30: 1000 mL

## 2012-03-30 MED ORDER — FLEET ENEMA 7-19 GM/118ML RE ENEM: 1.0000 | ENEMA | Freq: Once | RECTAL | Status: AC | PRN

## 2012-03-30 MED ORDER — POLYETHYLENE GLYCOL 3350 17 G PO PACK
17.0000 g | PACK | Freq: Every day | ORAL | Status: DC | PRN
  Administered 2012-04-01: 17 g via ORAL

## 2012-03-30 MED ORDER — MENTHOL 3 MG MT LOZG: 1.0000 | LOZENGE | OROMUCOSAL | Status: DC | PRN

## 2012-03-30 MED ORDER — HYDROMORPHONE HCL PF 1 MG/ML IJ SOLN: 0.2500 mg | INTRAMUSCULAR | Status: DC | PRN

## 2012-03-30 MED ORDER — LIDOCAINE HCL (CARDIAC) 20 MG/ML IV SOLN
INTRAVENOUS | Status: DC | PRN
  Administered 2012-03-30: 75 mg via INTRAVENOUS

## 2012-03-30 MED ORDER — ESTRADIOL 2 MG PO TABS
2.0000 mg | ORAL_TABLET | Freq: Every day | ORAL | Status: DC
  Administered 2012-03-31 – 2012-04-02 (×3): 2 mg via ORAL
  Filled 2012-03-30 (×4): qty 1

## 2012-03-30 MED ORDER — CHLORHEXIDINE GLUCONATE 4 % EX LIQD: 60.0000 mL | Freq: Once | CUTANEOUS | Status: DC

## 2012-03-30 MED ORDER — ATENOLOL 50 MG PO TABS
50.0000 mg | ORAL_TABLET | Freq: Every evening | ORAL | Status: DC
  Administered 2012-03-31: 50 mg via ORAL
  Filled 2012-03-30 (×4): qty 1

## 2012-03-30 MED ORDER — BUPIVACAINE LIPOSOME 1.3 % IJ SUSP
20.0000 mL | Freq: Once | INTRAMUSCULAR | Status: AC
  Administered 2012-03-30: 20 mL
  Filled 2012-03-30: qty 20

## 2012-03-30 MED ORDER — CEFAZOLIN SODIUM 1-5 GM-% IV SOLN
1.0000 g | Freq: Four times a day (QID) | INTRAVENOUS | Status: AC
  Administered 2012-03-30 (×2): 1 g via INTRAVENOUS
  Filled 2012-03-30 (×2): qty 50

## 2012-03-30 MED ORDER — PROPOFOL 10 MG/ML IV BOLUS
INTRAVENOUS | Status: DC | PRN
  Administered 2012-03-30: 150 mg via INTRAVENOUS

## 2012-03-30 MED ORDER — ACETAMINOPHEN 10 MG/ML IV SOLN
INTRAVENOUS | Status: DC | PRN
  Administered 2012-03-30: 1000 mg via INTRAVENOUS

## 2012-03-30 MED ORDER — FENTANYL CITRATE 0.05 MG/ML IJ SOLN
INTRAMUSCULAR | Status: DC | PRN
  Administered 2012-03-30 (×2): 50 ug via INTRAVENOUS
  Administered 2012-03-30: 100 ug via INTRAVENOUS
  Administered 2012-03-30 (×2): 50 ug via INTRAVENOUS
  Administered 2012-03-30 (×2): 100 ug via INTRAVENOUS

## 2012-03-30 MED ORDER — THROMBIN 5000 UNITS EX SOLR
CUTANEOUS | Status: AC
  Filled 2012-03-30: qty 5000

## 2012-03-30 MED ORDER — SODIUM CHLORIDE 0.9 % IR SOLN
Status: DC | PRN
  Administered 2012-03-30: 09:00:00

## 2012-03-30 MED ORDER — ONDANSETRON HCL 4 MG/2ML IJ SOLN
INTRAMUSCULAR | Status: AC
  Administered 2012-03-30: 14:00:00
  Filled 2012-03-30: qty 2

## 2012-03-30 MED ORDER — POTASSIUM CHLORIDE 20 MEQ PO PACK: 20.0000 meq | PACK | Freq: Two times a day (BID) | ORAL | Status: DC

## 2012-03-30 MED ORDER — LACTATED RINGERS IV SOLN
INTRAVENOUS | Status: DC
  Administered 2012-03-30 (×2): via INTRAVENOUS

## 2012-03-30 MED ORDER — RIVAROXABAN 10 MG PO TABS
10.0000 mg | ORAL_TABLET | Freq: Every day | ORAL | Status: DC
  Administered 2012-03-31 – 2012-04-02 (×3): 10 mg via ORAL
  Filled 2012-03-30 (×4): qty 1

## 2012-03-30 MED ORDER — CEFAZOLIN SODIUM-DEXTROSE 2-3 GM-% IV SOLR
INTRAVENOUS | Status: AC
  Filled 2012-03-30: qty 50

## 2012-03-30 MED ORDER — CEFAZOLIN SODIUM-DEXTROSE 2-3 GM-% IV SOLR
2.0000 g | INTRAVENOUS | Status: AC
  Administered 2012-03-30: 2 g via INTRAVENOUS

## 2012-03-30 MED ORDER — FERROUS SULFATE 325 (65 FE) MG PO TABS
325.0000 mg | ORAL_TABLET | Freq: Three times a day (TID) | ORAL | Status: DC
  Administered 2012-03-31 – 2012-04-02 (×8): 325 mg via ORAL
  Filled 2012-03-30 (×11): qty 1

## 2012-03-30 MED ORDER — NEOSTIGMINE METHYLSULFATE 1 MG/ML IJ SOLN
INTRAMUSCULAR | Status: DC | PRN
  Administered 2012-03-30: 3 mg via INTRAVENOUS

## 2012-03-30 MED ORDER — HYDROMORPHONE HCL PF 1 MG/ML IJ SOLN
1.0000 mg | INTRAMUSCULAR | Status: DC | PRN
  Administered 2012-03-30 – 2012-03-31 (×6): 1 mg via INTRAVENOUS
  Filled 2012-03-30 (×6): qty 1

## 2012-03-30 MED ORDER — LACTATED RINGERS IV SOLN
INTRAVENOUS | Status: DC
  Administered 2012-03-31: 06:00:00 via INTRAVENOUS

## 2012-03-30 MED ORDER — ONDANSETRON HCL 4 MG PO TABS: 4.0000 mg | ORAL_TABLET | Freq: Four times a day (QID) | ORAL | Status: DC | PRN

## 2012-03-30 MED ORDER — ACETAMINOPHEN 325 MG PO TABS: 650.0000 mg | ORAL_TABLET | Freq: Four times a day (QID) | ORAL | Status: DC | PRN

## 2012-03-30 MED ORDER — ACETAMINOPHEN 10 MG/ML IV SOLN
INTRAVENOUS | Status: AC
  Filled 2012-03-30: qty 100

## 2012-03-30 MED ORDER — ALUM & MAG HYDROXIDE-SIMETH 200-200-20 MG/5ML PO SUSP: 30.0000 mL | ORAL | Status: DC | PRN

## 2012-03-30 MED ORDER — MIDAZOLAM HCL 5 MG/5ML IJ SOLN
INTRAMUSCULAR | Status: DC | PRN
  Administered 2012-03-30: 2 mg via INTRAVENOUS

## 2012-03-30 MED ORDER — OXYCODONE-ACETAMINOPHEN 5-325 MG PO TABS
2.0000 | ORAL_TABLET | ORAL | Status: DC | PRN
  Administered 2012-03-31 – 2012-04-02 (×5): 2 via ORAL
  Filled 2012-03-30 (×5): qty 2

## 2012-03-30 MED ORDER — PHENOL 1.4 % MT LIQD: 1.0000 | OROMUCOSAL | Status: DC | PRN

## 2012-03-30 MED ORDER — HYDROCODONE-ACETAMINOPHEN 5-325 MG PO TABS
1.0000 | ORAL_TABLET | ORAL | Status: DC | PRN
  Administered 2012-03-30 – 2012-04-01 (×6): 2 via ORAL
  Administered 2012-04-01: 1 via ORAL
  Filled 2012-03-30 (×8): qty 2

## 2012-03-30 MED ORDER — ONDANSETRON HCL 4 MG/2ML IJ SOLN
4.0000 mg | Freq: Four times a day (QID) | INTRAMUSCULAR | Status: DC | PRN
  Administered 2012-03-30 – 2012-03-31 (×2): 4 mg via INTRAVENOUS
  Filled 2012-03-30 (×2): qty 2

## 2012-03-30 MED ORDER — GLYCOPYRROLATE 0.2 MG/ML IJ SOLN
INTRAMUSCULAR | Status: DC | PRN
  Administered 2012-03-30: 0.4 mg via INTRAVENOUS

## 2012-03-30 MED ORDER — ROCURONIUM BROMIDE 100 MG/10ML IV SOLN
INTRAVENOUS | Status: DC | PRN
  Administered 2012-03-30: 50 mg via INTRAVENOUS

## 2012-03-30 MED ORDER — BISACODYL 10 MG RE SUPP: 10.0000 mg | Freq: Every day | RECTAL | Status: DC | PRN

## 2012-03-30 SURGICAL SUPPLY — 61 items
BAG SPEC THK2 15X12 ZIP CLS (MISCELLANEOUS) ×1
BAG ZIPLOCK 12X15 (MISCELLANEOUS) ×2 IMPLANT
BANDAGE ELASTIC 4 VELCRO ST LF (GAUZE/BANDAGES/DRESSINGS) ×2 IMPLANT
BANDAGE ELASTIC 6 VELCRO ST LF (GAUZE/BANDAGES/DRESSINGS) ×2 IMPLANT
BANDAGE GAUZE ELAST BULKY 4 IN (GAUZE/BANDAGES/DRESSINGS) ×4 IMPLANT
BLADE SAG 18X100X1.27 (BLADE) ×2 IMPLANT
BLADE SAW SGTL 11.0X1.19X90.0M (BLADE) ×2 IMPLANT
BONE CEMENT GENTAMICIN (Cement) ×4 IMPLANT
CEMENT BONE GENTAMICIN 40 (Cement) ×2 IMPLANT
CLOTH BEACON ORANGE TIMEOUT ST (SAFETY) ×2 IMPLANT
CLSR STERI-STRIP ANTIMIC 1/2X4 (GAUZE/BANDAGES/DRESSINGS) ×2 IMPLANT
CUFF TOURN SGL QUICK 34 (TOURNIQUET CUFF) ×1
CUFF TRNQT CYL 34X4X40X1 (TOURNIQUET CUFF) ×1 IMPLANT
DRAPE EXTREMITY T 121X128X90 (DRAPE) ×2 IMPLANT
DRAPE INCISE IOBAN 66X45 STRL (DRAPES) ×2 IMPLANT
DRAPE LG THREE QUARTER DISP (DRAPES) ×2 IMPLANT
DRAPE POUCH INSTRU U-SHP 10X18 (DRAPES) ×2 IMPLANT
DRAPE U-SHAPE 47X51 STRL (DRAPES) ×2 IMPLANT
DRSG ADAPTIC 3X8 NADH LF (GAUZE/BANDAGES/DRESSINGS) ×2 IMPLANT
DRSG PAD ABDOMINAL 8X10 ST (GAUZE/BANDAGES/DRESSINGS) ×2 IMPLANT
DURAPREP 26ML APPLICATOR (WOUND CARE) ×2 IMPLANT
ELECT BLADE TIP CTD 4 INCH (ELECTRODE) ×2 IMPLANT
ELECT REM PT RETURN 9FT ADLT (ELECTROSURGICAL) ×2
ELECTRODE REM PT RTRN 9FT ADLT (ELECTROSURGICAL) ×1 IMPLANT
EVACUATOR 1/8 PVC DRAIN (DRAIN) ×2 IMPLANT
FACESHIELD LNG OPTICON STERILE (SAFETY) ×12 IMPLANT
GLOVE BIOGEL PI IND STRL 8 (GLOVE) ×1 IMPLANT
GLOVE BIOGEL PI IND STRL 8.5 (GLOVE) IMPLANT
GLOVE BIOGEL PI INDICATOR 8 (GLOVE) ×1
GLOVE BIOGEL PI INDICATOR 8.5 (GLOVE)
GLOVE ECLIPSE 8.0 STRL XLNG CF (GLOVE) ×4 IMPLANT
GLOVE SURG SS PI 6.5 STRL IVOR (GLOVE) ×4 IMPLANT
GOWN PREVENTION PLUS LG XLONG (DISPOSABLE) ×4 IMPLANT
GOWN STRL REIN XL XLG (GOWN DISPOSABLE) ×2 IMPLANT
HANDPIECE INTERPULSE COAX TIP (DISPOSABLE) ×2
IMMOBILIZER KNEE 20 (SOFTGOODS)
IMMOBILIZER KNEE 20 THIGH 36 (SOFTGOODS) IMPLANT
KIT BASIN OR (CUSTOM PROCEDURE TRAY) ×2 IMPLANT
MANIFOLD NEPTUNE II (INSTRUMENTS) ×2 IMPLANT
NEEDLE HYPO 22GX1.5 SAFETY (NEEDLE) ×2 IMPLANT
NS IRRIG 1000ML POUR BTL (IV SOLUTION) ×2 IMPLANT
PACK TOTAL JOINT (CUSTOM PROCEDURE TRAY) ×2 IMPLANT
POSITIONER SURGICAL ARM (MISCELLANEOUS) ×2 IMPLANT
SET HNDPC FAN SPRY TIP SCT (DISPOSABLE) ×1 IMPLANT
SPONGE LAP 18X18 X RAY DECT (DISPOSABLE) ×2 IMPLANT
SPONGE SURGIFOAM ABS GEL 100 (HEMOSTASIS) ×2 IMPLANT
STAPLER VISISTAT 35W (STAPLE) IMPLANT
SUCTION FRAZIER 12FR DISP (SUCTIONS) ×2 IMPLANT
SUT BONE WAX W31G (SUTURE) ×2 IMPLANT
SUT MNCRL AB 4-0 PS2 18 (SUTURE) ×2 IMPLANT
SUT VIC AB 1 CT1 27 (SUTURE) ×4
SUT VIC AB 1 CT1 27XBRD ANTBC (SUTURE) ×2 IMPLANT
SUT VIC AB 2-0 CT1 27 (SUTURE) ×2
SUT VIC AB 2-0 CT1 TAPERPNT 27 (SUTURE) ×2 IMPLANT
SUT VLOC 180 0 24IN GS25 (SUTURE) ×2 IMPLANT
SYR 20CC LL (SYRINGE) ×2 IMPLANT
TOWEL OR 17X26 10 PK STRL BLUE (TOWEL DISPOSABLE) ×4 IMPLANT
TOWER CARTRIDGE SMART MIX (DISPOSABLE) ×2 IMPLANT
TRAY FOLEY CATH 14FRSI W/METER (CATHETERS) ×2 IMPLANT
WATER STERILE IRR 1500ML POUR (IV SOLUTION) ×2 IMPLANT
WRAP KNEE MAXI GEL POST OP (GAUZE/BANDAGES/DRESSINGS) ×4 IMPLANT

## 2012-03-30 NOTE — Brief Op Note (Signed)
03/30/2012  10:16 AM  PATIENT:  Patricia Krueger  62 y.o. female  PRE-OPERATIVE DIAGNOSIS:  OA RIGHT KNEE  POST-OPERATIVE DIAGNOSIS:  OA RIGHT KNEE  PROCEDURE:  Procedure(s): TOTAL KNEE ARTHROPLASTY (Right)  SURGEON:  Surgeon(s) and Role:    * Jacki Cones, MD - Primary  PHYSICIAN ASSISTANT: Dimitri Ped PA  ASSISTANTS: Dimitri Ped PA   ANESTHESIA:   general  EBL:  Total I/O In: 1000 [I.V.:1000] Out: 100 [Urine:100]  BLOOD ADMINISTERED:none  DRAINS: (one) Hemovact drain(s) in the Right Knee with  Suction Open   LOCAL MEDICATIONS USED:  BUPIVICAINE 20cc mixed with 20cc of Normal Saline  SPECIMEN:  No Specimen  DISPOSITION OF SPECIMEN:  N/A  COUNTS:  YES  TOURNIQUET:   Total Tourniquet Time Documented: Thigh (Right) - 82 minutes Total: Thigh (Right) - 82 minutes   DICTATION: .Other Dictation: Dictation Number [id number]  PLAN OF CARE: Admit to inpatient   PATIENT DISPOSITION:  Stable in OR.   Delay start of Pharmacological VTE agent (>24hrs) due to surgical blood loss or risk of bleeding: yes

## 2012-03-30 NOTE — Progress Notes (Signed)
PT Cancellation Note  Patient Details Name: MICKENZIE STOLAR MRN: [id number] DOB: 08-01-1950   Cancelled Treatment:    Reason Eval/Treat Not Completed: Fatigue/lethargy limiting ability to participate Pt reports too tired for therapy POD #0 and would like to start tomorrow.   Rossy Virag,[name] 03/30/2012, 3:19 PM Pager: [phone]

## 2012-03-30 NOTE — Anesthesia Preprocedure Evaluation (Addendum)
Anesthesia Evaluation  Patient identified by MRN, date of birth, ID band Patient awake    Reviewed: Allergy & Precautions, H&P , NPO status , Patient's Chart, lab work & pertinent test results, reviewed documented beta blocker date and time   Airway Mallampati: II TM Distance: >3 FB Neck ROM: full    Dental no notable dental hx. (+) Teeth Intact and Dental Advisory Given   Pulmonary neg pulmonary ROS,  breath sounds clear to auscultation  Pulmonary exam normal       Cardiovascular Exercise Tolerance: Good hypertension, Pt. on home beta blockers Rhythm:regular Rate:Normal     Neuro/Psych negative neurological ROS  negative psych ROS   GI/Hepatic negative GI ROS, Neg liver ROS,   Endo/Other  negative endocrine ROS  Renal/GU negative Renal ROS  negative genitourinary   Musculoskeletal   Abdominal   Peds  Hematology negative hematology ROS (+)   Anesthesia Other Findings   Reproductive/Obstetrics negative OB ROS                          Anesthesia Physical Anesthesia Plan  ASA: II  Anesthesia Plan: General   Post-op Pain Management:    Induction: Intravenous  Airway Management Planned: Oral ETT  Additional Equipment:   Intra-op Plan:   Post-operative Plan: Extubation in OR  Informed Consent: I have reviewed the patients History and Physical, chart, labs and discussed the procedure including the risks, benefits and alternatives for the proposed anesthesia with the patient or authorized representative who has indicated his/her understanding and acceptance.   Dental Advisory Given  Plan Discussed with: CRNA and Surgeon  Anesthesia Plan Comments:         Anesthesia Quick Evaluation

## 2012-03-30 NOTE — Interval H&P Note (Signed)
History and Physical Interval Note:  03/30/2012 8:12 AM  Patricia Krueger  has presented today for surgery, with the diagnosis of OA RIGHT KNEE  The various methods of treatment have been discussed with the patient and family. After consideration of risks, benefits and other options for treatment, the patient has consented to  Procedure(s): TOTAL KNEE ARTHROPLASTY (Right) as a surgical intervention .  The patient's history has been reviewed, patient examined, no change in status, stable for surgery.  I have reviewed the patient's chart and labs.  Questions were answered to the patient's satisfaction.     Desman Polak A

## 2012-03-30 NOTE — Preoperative (Signed)
Beta Blockers   Reason not to administer Beta Blockers:Took Atenolol last pm. 

## 2012-03-30 NOTE — Transfer of Care (Signed)
Immediate Anesthesia Transfer of Care Note  Patient: Patricia Krueger  Procedure(s) Performed: Procedure(s): TOTAL KNEE ARTHROPLASTY (Right)  Patient Location: PACU  Anesthesia Type:General  Level of Consciousness: awake, alert , oriented and patient cooperative  Airway & Oxygen Therapy: Patient Spontanous Breathing and Patient connected to face mask oxygen  Post-op Assessment: Report given to PACU RN and Post -op Vital signs reviewed and stable  Post vital signs: Reviewed and stable  Complications: No apparent anesthesia complications

## 2012-03-30 NOTE — Anesthesia Postprocedure Evaluation (Signed)
  Anesthesia Post-op Note  Patient: Patricia Krueger  Procedure(s) Performed: Procedure(s) (LRB): TOTAL KNEE ARTHROPLASTY (Right)  Patient Location: PACU  Anesthesia Type: General  Level of Consciousness: awake and alert   Airway and Oxygen Therapy: Patient Spontanous Breathing  Post-op Pain: mild  Post-op Assessment: Post-op Vital signs reviewed, Patient's Cardiovascular Status Stable, Respiratory Function Stable, Patent Airway and No signs of Nausea or vomiting  Last Vitals:  Filed Vitals:   03/30/12 1045  BP: 151/76  Pulse:   Temp: 36.6 C  Resp:     Post-op Vital Signs: stable   Complications: No apparent anesthesia complications

## 2012-03-30 NOTE — Op Note (Signed)
NAMEKAMYRAH, FEESER              ACCOUNT NO.:  [id number]  MEDICAL RECORD NO.:  [id number]  LOCATION:  [facility]                         FACILITY:  [facility]  PHYSICIAN:  Georges Lynch. Walid Haig, M.D.DATE OF BIRTH:  1950/08/19  DATE OF PROCEDURE:  03/30/2012 DATE OF DISCHARGE:                              OPERATIVE REPORT   SURGEON:  Georges Lynch. Darrelyn Hillock, M.D.  ASSISTANT:  Dimitri Ped, [place]  PREOPERATIVE DIAGNOSIS:  Degenerative arthritis or osteoarthritis, right knee.  POSTOPERATIVE DIAGNOSIS:  Degenerative arthritis or osteoarthritis, right knee.  OPERATION:  Right total knee arthroplasty.  I utilized a size 3 right femoral component posterior cruciate sacrificing type, tibial tray was a size 2-1/2, the insert was a size 3, 10 mm thickness, patella was a size 32 patella.  The appropriate time-out was carried out first before any surgery.  Also marked the appropriate right leg in the holding area.  After the sterile prep and drape was carried out, leg was exsanguinated with an Esmarch, tourniquet was elevated at 350 mmHg.  PROCEDURE:  Under general anesthesia, routine orthopedic prepping and draping as I mentioned of the right lower extremity was carried out. After the time-out, we made an incision over the anterior aspect of the right knee.  Bleeders were identified and cauterized.  We did elevate the tourniquet after we exsanguinated the leg.  The tourniquet was elevated as mentioned to 350 mmHg.  After the incision was made, we created 2 flaps.  The knee was flexed and we did a median parapatellar incision.  I reflected the patella laterally, flexed the knee and did medial and lateral meniscectomies and excised the anterior and posterior cruciate ligaments.  At this time, initial drill hole was made in the intercondylar notch.  I removed 12 mm thickness off the distal femur at this time.  Following that, the femur was measured to be a size 3.  I cut my femur of the anterior,  posterior, chamfering cuts for a size 3 right femur.  Following that, I then prepared the tibia in the usual fashion.  We removed 4 mm thickness off the affected side of the tibia. We did a nice medial release as well.  Following that, after the tibia and femur were prepared, I then inserted my spacer blocks.  We had excellent tension and both medial and lateral tension and good flexion extension position.  At this particular point, I then continued to prepare my tibia, the initial drill hole was made again in the tibia, and we then utilized our keel cut.  Following that, I prepared the femur by preparing the notch cut.  We then thoroughly irrigated out the knee, inserted our trial components, and then with the knee in extension, I did my resurfacing procedure on the patella for a size 32 mm patella. At this time, 3 drill holes were made in the patella.  All trial components were removed.  I thoroughly water picked out the knee, and then inserted all 3 components and cemented them simultaneously with gentamicin in the cement.  Once the cement was dried out, we then removed all loose pieces of cement.  I water picked the posterior aspect of the knee as well to  make sure there were no loose piece of cement posteriorly.  At that particular time, I then checked my insert, we felt that we had the best tension in flexion/extension with the 10 mm thickness insert, so I first injected some Exparel with normal saline. I had a mixture of 20 mL of Exparel with 20 mL of normal saline.  I injected part of that into the soft tissue structures.  At this particular time, I then inserted my permanent tibial rotating platform, size 3, 10 mm thickness, reduced the knee, had excellent function.  I then inserted my Hemovac drain and closed the knee in layers in usual fashion.  Note, sterile dressings were applied.  The patient did have 2 g of IV Ancef preop.          ______________________________ Georges Lynch. Darrelyn Hillock, M.D.     RAG/MEDQ  D:  03/30/2012  T:  03/30/2012  Job:  [id number]

## 2012-03-31 DIAGNOSIS — D62 Acute posthemorrhagic anemia: Secondary | ICD-10-CM | POA: Diagnosis present

## 2012-03-31 LAB — CBC
MCH: 29.7 pg (ref 26.0–34.0)
MCHC: 32.3 g/dL (ref 30.0–36.0)
MCV: 92.1 fL (ref 78.0–100.0)
Platelets: 184 10*3/uL (ref 150–400)
RDW: 12.3 % (ref 11.5–15.5)

## 2012-03-31 LAB — BASIC METABOLIC PANEL
CO2: 27 mEq/L (ref 19–32)
Calcium: 8.3 mg/dL — ABNORMAL LOW (ref 8.4–10.5)
Creatinine, Ser: 0.85 mg/dL (ref 0.50–1.10)
GFR calc Af Amer: 85 mL/min — ABNORMAL LOW (ref >90)
GFR calc non Af Amer: 74 mL/min — ABNORMAL LOW (ref >90)
Sodium: 136 mEq/L (ref 135–145)

## 2012-03-31 MED ORDER — DIPHENHYDRAMINE HCL 12.5 MG/5ML PO ELIX
12.5000 mg | ORAL_SOLUTION | Freq: Four times a day (QID) | ORAL | Status: DC | PRN
  Administered 2012-03-31 (×2): 12.5 mg via ORAL
  Filled 2012-03-31 (×3): qty 5

## 2012-03-31 NOTE — Progress Notes (Signed)
Subjective: 1 Day Post-Op Procedure(s) (LRB): TOTAL KNEE ARTHROPLASTY (Right) Patient reports pain as 7 on 0-10 scale. Hemovac DCd. Will ambulate today.   Objective: Vital signs in last 24 hours: Temp:  [97.2 F (36.2 C)-97.8 F (36.6 C)] 97.7 F (36.5 C) (02/13 0503) Pulse Rate:  [62-96] 96 (02/13 0503) Resp:  [8-17] 16 (02/13 0503) BP: (104-147)/(66-84) 116/72 mmHg (02/13 0503) SpO2:  [95 %-100 %] 100 % (02/13 0503) Weight:  [99.338 kg (219 lb)] 99.338 kg (219 lb) (02/12 1216)  Intake/Output from previous day: 02/12 0701 - 02/13 0700 In: 3977.3 [P.O.:510; I.V.:3262.3; IV Piggyback:205] Out: 1625 [Urine:950; Drains:675] Intake/Output this shift:     Recent Labs  03/31/12 0420  HGB 10.2*    Recent Labs  03/31/12 0420  WBC 5.3  RBC 3.43*  HCT 31.6*  PLT 184    Recent Labs  03/31/12 0420  NA 136  K 3.1*  CL 100  CO2 27  BUN 11  CREATININE 0.85  GLUCOSE 123*  CALCIUM 8.3*   No results found for this basename: LABPT, INR,  in the last 72 hours  Neurologically intact Dorsiflexion/Plantar flexion intact  Assessment/Plan: 1 Day Post-Op Procedure(s) (LRB): TOTAL KNEE ARTHROPLASTY (Right) Up with therapy. DC Plans for Saturday.  Montez Cuda A 03/31/2012, 8:11 AM

## 2012-03-31 NOTE — Progress Notes (Signed)
Physical Therapy Treatment Patient Details Name: Patricia Krueger MRN: [id number] DOB: 1951/01/17 Today's Date: 03/31/2012 Time: 7829-5621 PT Time Calculation (min): 24 min  PT Assessment / Plan / Recommendation Comments on Treatment Session  Pt is more alert at this time. Pt. is awake and ambulated in hall.    Follow Up Recommendations  Home health PT     Does the patient have the potential to tolerate intense rehabilitation     Barriers to Discharge        Equipment Recommendations  Rolling walker with 5" wheels    Recommendations for Other Services    Frequency 7X/week   Plan Discharge plan remains appropriate;Frequency remains appropriate    Precautions / Restrictions Precautions Precautions: Knee Required Braces or Orthoses: Knee Immobilizer - Right Restrictions Weight Bearing Restrictions: No   Pertinent Vitals/Pain No pain    Mobility  Bed Mobility Bed Mobility: Sit to Supine Supine to Sit: 4: Min assist Sit to Supine: 4: Min assist Details for Bed Mobility Assistance: support for RLE onto bed. Transfers Transfers: Sit to Stand Sit to Stand: With armrests;From chair/3-in-1;4: Min assist Sit to Stand: Patient Percentage: 60% Stand to Sit: To bed;With armrests;4: Min assist Stand to Sit: Patient Percentage: 60% Stand Pivot Transfers: 1: +2 Total assist Stand Pivot Transfers: Patient Percentage: 60% Details for Transfer Assistance: pt able to participate, Ambulation/Gait Ambulation/Gait Assistance: 4: Min assist Ambulation Distance (Feet): 60 Feet Assistive device: Rolling walker Gait Pattern: Step-through pattern;Decreased step length - right    Exercises Total Joint Exercises Quad Sets: AROM;Right;10 reps Heel Slides: AAROM;Right;10 reps Straight Leg Raises: AAROM;Right;10 reps   PT Diagnosis: Difficulty walking;Generalized weakness;Acute pain  PT Problem List: Decreased strength;Decreased range of motion;Decreased activity tolerance;Decreased  mobility;Decreased safety awareness;Decreased knowledge of precautions;Pain PT Treatment Interventions: DME instruction;Gait training;Stair training;Functional mobility training;Therapeutic activities;Therapeutic exercise;Patient/family education   PT Goals Acute Rehab PT Goals PT Goal Formulation: With patient/family Time For Goal Achievement: 04/07/12 Potential to Achieve Goals: Good Pt will go Supine/Side to Sit: with supervision PT Goal: Supine/Side to Sit - Progress: Goal set today Pt will go Sit to Supine/Side: with supervision PT Goal: Sit to Supine/Side - Progress: Progressing toward goal Pt will go Sit to Stand: with supervision PT Goal: Sit to Stand - Progress: Progressing toward goal Pt will go Stand to Sit: with supervision PT Goal: Stand to Sit - Progress: Progressing toward goal Pt will Ambulate: 51 - 150 feet;with supervision;with rolling walker PT Goal: Ambulate - Progress: Progressing toward goal Pt will Go Up / Down Stairs: 1-2 stairs;with min assist;with least restrictive assistive device PT Goal: Up/Down Stairs - Progress: Goal set today Pt will Perform Home Exercise Program: with supervision, verbal cues required/provided PT Goal: Perform Home Exercise Program - Progress: Progressing toward goal  Visit Information  Last PT Received On: 03/31/12 Assistance Needed: +1    Subjective Data  Subjective: I am awake Patient Stated Goal: to go home.   Cognition  Cognition Overall Cognitive Status: Appears within functional limits for tasks assessed/performed Arousal/Alertness: Awake/alert Orientation Level: Appears intact for tasks assessed Behavior During Session: [facility] for tasks performed Cognition - Other Comments: had IV medication, very drowsy.    Balance  Balance Balance Assessed: Yes Static Sitting Balance Static Sitting - Balance Support: Feet supported;Bilateral upper extremity supported Static Sitting - Level of Assistance: 5: Stand by assistance  End  of Session PT - End of Session Equipment Utilized During Treatment: Right knee immobilizer Activity Tolerance: Patient tolerated treatment well Patient left: in bed;with  family/visitor present;with call bell/phone within reach Nurse Communication: Mobility status   GP     [name] 03/31/2012, 2:43 PM

## 2012-03-31 NOTE — Evaluation (Signed)
Occupational Therapy Evaluation Patient Details Name: MARTHA ELLERBY MRN: [id number] DOB: [birth year] Today's Date: 03/31/2012 Time: 4098-1191 OT Time Calculation (min): 21 min  OT Assessment / Plan / Recommendation Clinical Impression  This 62 y.o. female admitted for THA.  Pt. lethargic this am with difficulty keeping eyes open during eval.   BP EOB 108/66; and 111/70 after transferring to chair.  Pt will benefit from OT for the below listed deficits to allow pt to return home with family and min A    OT Assessment  Patient needs continued OT Services    Follow Up Recommendations  No OT follow up;Supervision/Assistance - 24 hour    Barriers to Discharge None    Equipment Recommendations  3 in 1 bedside comode (tub DME TBD)    Recommendations for Other Services    Frequency  Min 2X/week    Precautions / Restrictions Precautions Precautions: Knee Required Braces or Orthoses: Knee Immobilizer - Right Restrictions Weight Bearing Restrictions: No       ADL  Eating/Feeding: Set up Where Assessed - Eating/Feeding: Chair Grooming: Wash/dry hands;Wash/dry face;Teeth care;Set up Where Assessed - Grooming: Supported sitting Upper Body Bathing: Minimal assistance Where Assessed - Upper Body Bathing: Supported sitting Lower Body Bathing: Maximal assistance Where Assessed - Lower Body Bathing: Supported sit to stand Upper Body Dressing: Moderate assistance Where Assessed - Upper Body Dressing: Unsupported sitting Lower Body Dressing: +1 Total assistance Where Assessed - Lower Body Dressing: Supported sit to stand Toilet Transfer: +2 Total assistance Toilet Transfer: Patient Percentage: 60% Toilet Transfer Method: Sit to stand;Stand pivot Acupuncturist: Other (comment) (recliner) Toileting - Clothing Manipulation and Hygiene: +1 Total assistance Where Assessed - Toileting Clothing Manipulation and Hygiene: Standing Equipment Used: Knee Immobilizer;Rolling  walker Transfers/Ambulation Related to ADLs: Pt only able to transfer to recliner due to dizziness and nausea.  Transferred with total A +2 (pt ~60%)    OT Diagnosis: Generalized weakness;Acute pain  OT Problem List: Decreased activity tolerance;Decreased knowledge of use of DME or AE;Decreased knowledge of precautions;Pain OT Treatment Interventions:     OT Goals Acute Rehab OT Goals OT Goal Formulation: With patient Time For Goal Achievement: 04/07/12 Potential to Achieve Goals: Good ADL Goals Pt Will Perform Grooming: with supervision;Standing at sink ADL Goal: Grooming - Progress: Goal set today Pt Will Perform Lower Body Bathing: with supervision;Sit to stand from chair;Sit to stand from bed ADL Goal: Lower Body Bathing - Progress: Goal set today Pt Will Perform Lower Body Dressing: with supervision;Sit to stand from chair;Sit to stand from bed ADL Goal: Lower Body Dressing - Progress: Goal set today Pt Will Transfer to Toilet: with supervision;Ambulation;Comfort height toilet;3-in-1 ADL Goal: Toilet Transfer - Progress: Goal set today Pt Will Perform Toileting - Clothing Manipulation: with supervision;Standing ADL Goal: Toileting - Clothing Manipulation - Progress: Goal set today Pt Will Perform Tub/Shower Transfer: with min assist;with DME;Ambulation ADL Goal: Tub/Shower Transfer - Progress: Goal set today  Visit Information  Last OT Received On: 03/31/12 Assistance Needed: +1 PT/OT Co-Evaluation/Treatment: Yes    Subjective Data  Subjective: "I don't feel anything"  "I just got pain medicine and a muscle relaxer" Patient Stated Goal: To get better and go home   Prior Functioning     Home Living Lives With: Family Available Help at Discharge: Family Type of Home: Apartment Home Access: Stairs to enter Secretary/administrator of Steps: 1 Home Layout: One level Bathroom Shower/Tub: Engineer, manufacturing systems: Standard Home Adaptive Equipment: None Prior  Function Level of  Independence: Independent Able to Take Stairs?: Yes Driving: Yes Vocation: Full time employment Communication Communication: No difficulties         Vision/Perception Perception Perception: Within Functional Limits   Cognition  Cognition Overall Cognitive Status: Appears within functional limits for tasks assessed/performed Arousal/Alertness: Awake/alert Orientation Level: Appears intact for tasks assessed Behavior During Session: [facility] for tasks performed Cognition - Other Comments: had IV medication, very drowsy.    Extremity/Trunk Assessment Right Upper Extremity Assessment RUE ROM/Strength/Tone: Within functional levels RUE Coordination: WFL - gross/fine motor Left Upper Extremity Assessment LUE ROM/Strength/Tone: Within functional levels LUE Coordination: WFL - gross/fine motor Right Lower Extremity Assessment RLE ROM/Strength/Tone: Deficits RLE ROM/Strength/Tone Deficits: assistance to lift leg RLE Sensation: WFL - Light Touch Left Lower Extremity Assessment LLE ROM/Strength/Tone: Within functional levels Trunk Assessment Trunk Assessment: Normal     Mobility Bed Mobility Bed Mobility: Sit to Supine Supine to Sit: 4: Min assist Sitting - Scoot to Edge of Bed: 4: Min assist Sit to Supine: 4: Min assist Details for Bed Mobility Assistance: support for RLE onto bed. Transfers Transfers: Sit to Stand;Stand to Sit Sit to Stand: With armrests;From chair/3-in-1;4: Min assist Sit to Stand: Patient Percentage: 60% Stand to Sit: To bed;With armrests;4: Min assist Stand to Sit: Patient Percentage: 60% Details for Transfer Assistance: pt able to participate,     Exercise Total Joint Exercises Quad Sets: AROM;Right;10 reps Heel Slides: AAROM;Right;10 reps Straight Leg Raises: AAROM;Right;10 reps   Balance Balance Balance Assessed: Yes Static Sitting Balance Static Sitting - Balance Support: Feet supported;Bilateral upper extremity  supported Static Sitting - Level of Assistance: 5: Stand by assistance   End of Session OT - End of Session Activity Tolerance: Patient limited by fatigue Patient left: in chair;with family/visitor present;with call bell/phone within reach  GO     Corneisha Alvi M 03/31/2012, 3:12 PM

## 2012-03-31 NOTE — Progress Notes (Signed)
Utilization review completed.  

## 2012-03-31 NOTE — Evaluation (Signed)
Physical Therapy Evaluation Patient Details Name: Patricia Krueger MRN: [id number] DOB: 1950-05-16 Today's Date: 03/31/2012 Time: 9563-8756 PT Time Calculation (min): 18 min  PT Assessment / Plan / Recommendation Clinical Impression  Pt. admitted 2/12 for RTKA. Pt. very drowsy during eval.Pt. plans to DC to her home with cargivers. Pt. will benfit from PT while in acute care.    PT Assessment  Patient needs continued PT services    Follow Up Recommendations  Home health PT    Does the patient have the potential to tolerate intense rehabilitation      Barriers to Discharge        Equipment Recommendations  Rolling walker with 5" wheels    Recommendations for Other Services     Frequency 7X/week    Precautions / Restrictions Precautions Precautions: Knee Required Braces or Orthoses: Knee Immobilizer - Right Restrictions Weight Bearing Restrictions: No   Pertinent Vitals/Pain No pain      Mobility  Bed Mobility Bed Mobility: Supine to Sit Supine to Sit: 4: Min assist Details for Bed Mobility Assistance: cues for technique, keeping awake Transfers Transfers: Sit to Stand;Stand to Sit;Stand Pivot Transfers Sit to Stand: 1: +2 Total assist;From bed Sit to Stand: Patient Percentage: 60% Stand to Sit: 1: +2 Total assist;To chair/3-in-1;With upper extremity assist Stand to Sit: Patient Percentage: 60% Stand Pivot Transfers: 1: +2 Total assist Stand Pivot Transfers: Patient Percentage: 60% Details for Transfer Assistance: Pt. very lethargic, eyes closing, c/o dizziness. Ambulation/Gait Ambulation/Gait Assistance: Not tested (comment)    Exercises     PT Diagnosis: Difficulty walking;Generalized weakness;Acute pain  PT Problem List: Decreased strength;Decreased range of motion;Decreased activity tolerance;Decreased mobility;Decreased safety awareness;Decreased knowledge of precautions;Pain PT Treatment Interventions: DME instruction;Gait training;Stair  training;Functional mobility training;Therapeutic activities;Therapeutic exercise;Patient/family education   PT Goals Acute Rehab PT Goals PT Goal Formulation: With patient/family Time For Goal Achievement: 04/07/12 Potential to Achieve Goals: Good Pt will go Supine/Side to Sit: with supervision PT Goal: Supine/Side to Sit - Progress: Goal set today Pt will go Sit to Supine/Side: with supervision PT Goal: Sit to Supine/Side - Progress: Goal set today Pt will go Sit to Stand: with supervision PT Goal: Sit to Stand - Progress: Goal set today Pt will go Stand to Sit: with supervision PT Goal: Stand to Sit - Progress: Goal set today Pt will Ambulate: 51 - 150 feet;with supervision;with rolling walker PT Goal: Ambulate - Progress: Goal set today Pt will Go Up / Down Stairs: 1-2 stairs;with min assist;with least restrictive assistive device PT Goal: Up/Down Stairs - Progress: Goal set today Pt will Perform Home Exercise Program: with supervision, verbal cues required/provided PT Goal: Perform Home Exercise Program - Progress: Goal set today  Visit Information  Last PT Received On: 03/31/12 Assistance Needed: +2    Subjective Data  Subjective: I am doped up. Patient Stated Goal: to go home.   Prior Functioning  Home Living Lives With: Family Available Help at Discharge: Family Type of Home: Apartment Home Access: Stairs to enter Secretary/administrator of Steps: 1 Home Layout: One level Bathroom Shower/Tub: Network engineer: None Prior Function Level of Independence: Independent Able to Take Stairs?: Yes Driving: Yes Vocation: Full time employment Communication Communication: No difficulties    Cognition  Cognition Overall Cognitive Status: Appears within functional limits for tasks assessed/performed Arousal/Alertness: Lethargic Orientation Level: Appears intact for tasks assessed Behavior During Session:  Lethargic Cognition - Other Comments: had IV medication, very drowsy.    Extremity/Trunk Assessment  Right Lower Extremity Assessment RLE ROM/Strength/Tone: Deficits RLE ROM/Strength/Tone Deficits: assistance to lift leg RLE Sensation: WFL - Light Touch Left Lower Extremity Assessment LLE ROM/Strength/Tone: Within functional levels   Balance Balance Balance Assessed: Yes Static Sitting Balance Static Sitting - Balance Support: Feet supported;Bilateral upper extremity supported Static Sitting - Level of Assistance: 5: Stand by assistance  End of Session PT - End of Session Equipment Utilized During Treatment: Right knee immobilizer Activity Tolerance: Treatment limited secondary to medication Patient left: in chair;with call bell/phone within reach;with family/visitor present Nurse Communication: Mobility status  GP     [name] 03/31/2012, 2:35 PM  [phone]

## 2012-04-01 ENCOUNTER — Encounter (HOSPITAL_COMMUNITY): Payer: Self-pay | Admitting: Orthopedic Surgery

## 2012-04-01 LAB — HEMOGLOBIN AND HEMATOCRIT, BLOOD
HCT: 26.9 % — ABNORMAL LOW (ref 36.0–46.0)
Hemoglobin: 8.9 g/dL — ABNORMAL LOW (ref 12.0–15.0)

## 2012-04-01 LAB — CBC
MCV: 91.3 fL (ref 78.0–100.0)
Platelets: 153 10*3/uL (ref 150–400)
RDW: 12.2 % (ref 11.5–15.5)
WBC: 6.9 10*3/uL (ref 4.0–10.5)

## 2012-04-01 LAB — BASIC METABOLIC PANEL
Chloride: 101 mEq/L (ref 96–112)
Creatinine, Ser: 0.89 mg/dL (ref 0.50–1.10)
GFR calc Af Amer: 81 mL/min — ABNORMAL LOW (ref >90)
Sodium: 134 mEq/L — ABNORMAL LOW (ref 135–145)

## 2012-04-01 MED ORDER — POLYETHYLENE GLYCOL 3350 17 G PO PACK: 17.0000 g | PACK | Freq: Every day | ORAL | Status: DC | PRN

## 2012-04-01 MED ORDER — OXYCODONE-ACETAMINOPHEN 5-325 MG PO TABS: 1.0000 | ORAL_TABLET | ORAL | Status: DC | PRN

## 2012-04-01 MED ORDER — RIVAROXABAN 10 MG PO TABS: 10.0000 mg | ORAL_TABLET | Freq: Every day | ORAL | Status: DC

## 2012-04-01 MED ORDER — FERROUS SULFATE 325 (65 FE) MG PO TABS: 325.0000 mg | ORAL_TABLET | Freq: Three times a day (TID) | ORAL | Status: DC

## 2012-04-01 MED ORDER — METHOCARBAMOL 500 MG PO TABS: 500.0000 mg | ORAL_TABLET | Freq: Three times a day (TID) | ORAL | Status: DC

## 2012-04-01 MED ORDER — BISACODYL 10 MG RE SUPP: 10.0000 mg | Freq: Every day | RECTAL | Status: DC | PRN

## 2012-04-01 NOTE — Progress Notes (Signed)
Physical Therapy Treatment Patient Details Name: Patricia Krueger MRN: [id number] DOB: 22-Apr-1950 Today's Date: 04/01/2012 Time: 0752-0823 PT Time Calculation (min): 31 min  PT Assessment / Plan / Recommendation Comments on Treatment Session  Pt. is mobilizing very well today. Pt amb. x 100 ft w/out KI. will practice 1 step today. Family present and is assisting pt. as needed.    Follow Up Recommendations  Home health PT     Does the patient have the potential to tolerate intense rehabilitation     Barriers to Discharge        Equipment Recommendations  Rolling walker with 5" wheels    Recommendations for Other Services    Frequency 7X/week   Plan Discharge plan remains appropriate;Frequency remains appropriate    Precautions / Restrictions Precautions Precautions: Knee Required Braces or Orthoses:  (pt did not have on KI.did ok) Restrictions Weight Bearing Restrictions: No   Pertinent Vitals/Pain Throb-R knee. States IV in wrist is worse than Knee.   Mobility  Transfers Sit to Stand: 6: Modified independent (Device/Increase time);With armrests;From chair/3-in-1;With upper extremity assist Stand to Sit: 5: Supervision;To chair/3-in-1;With armrests;With upper extremity assist Ambulation/Gait Ambulation/Gait Assistance: 5: Supervision Ambulation Distance (Feet): 100 Feet Assistive device: Rolling walker Ambulation/Gait Assistance Details: Pt. ambulated w/ no KI. knee is flexed. encouraged to try to straighten.    Exercises Total Joint Exercises Heel Slides: AROM;Right;10 reps;Seated   PT Diagnosis:    PT Problem List:   PT Treatment Interventions:     PT Goals Acute Rehab PT Goals Pt will go Sit to Stand: with supervision PT Goal: Sit to Stand - Progress: Met Pt will go Stand to Sit: with supervision PT Goal: Stand to Sit - Progress: Met Pt will Ambulate: 51 - 150 feet;with supervision;with rolling walker PT Goal: Ambulate - Progress: Progressing toward  goal  Visit Information  Last PT Received On: 04/01/12 Assistance Needed: +1    Subjective Data  Subjective: This IV is killing my. My knee is not bad.   Cognition  Cognition Overall Cognitive Status: Appears within functional limits for tasks assessed/performed    Balance     End of Session PT - End of Session Activity Tolerance: Patient tolerated treatment well Patient left: in chair;with call bell/phone within reach;with family/visitor present Nurse Communication: Mobility status   GP     [name] 04/01/2012, 8:46 AM [phone]

## 2012-04-01 NOTE — Progress Notes (Signed)
Occupational Therapy Treatment Patient Details Name: Patricia Krueger MRN: [id number] DOB: Sep 11, 1950 Today's Date: 04/01/2012 Time: 4098-1191 OT Time Calculation (min): 12 min  OT Assessment / Plan / Recommendation Comments on Treatment Session      Follow Up Recommendations  No OT follow up;Supervision/Assistance - 24 hour    Barriers to Discharge       Equipment Recommendations  3 in 1 bedside comode    Recommendations for Other Services    Frequency     Plan      Precautions / Restrictions Precautions Precautions: Knee Required Braces or Orthoses:  (pt did not have on KI.did ok) Restrictions Weight Bearing Restrictions: No   Pertinent Vitals/Pain No pain except IV site when pushing through walker    ADL  Toilet Transfer: Supervision/safety Toilet Transfer Method: Sit to Barista: Raised toilet seat with arms (or 3-in-1 over toilet) Tub/Shower Transfer: Insurance risk surveyor Method: Science writer: Walk in shower Transfers/Ambulation Related to ADLs: pt able to step into shower; IV painful when pushing down ADL Comments: will have sister help with LB adls    OT Diagnosis:    OT Problem List:   OT Treatment Interventions:     OT Goals Acute Rehab OT Goals OT Goal Formulation: With patient ADL Goals Pt Will Transfer to Toilet: with supervision;Ambulation;Comfort height toilet;3-in-1 ADL Goal: Toilet Transfer - Progress: Met Pt Will Perform Tub/Shower Transfer: with min assist;with DME;Ambulation ADL Goal: Tub/Shower Transfer - Progress: Met  Visit Information  Last OT Received On: 04/01/12 Assistance Needed: +1    Subjective Data      Prior Functioning       Cognition  Cognition Overall Cognitive Status: Appears within functional limits for tasks assessed/performed Behavior During Session: [facility] for tasks performed    Mobility  Transfers Sit to Stand: 6: Modified independent (Device/Increase  time);With armrests;From chair/3-in-1;With upper extremity assist Stand to Sit: 5: Supervision;To chair/3-in-1;With armrests;With upper extremity assist    Exercises     Balance     End of Session OT - End of Session Activity Tolerance: Patient tolerated treatment well Patient left: in chair;with family/visitor present;with call bell/phone within reach  GO     [facility] 04/01/2012, 9:49 AM Marica Otter, OTR/L [phone] 04/01/2012

## 2012-04-01 NOTE — Progress Notes (Addendum)
Physical Therapy Treatment Patient Details Name: Patricia Krueger MRN: [id number] DOB: 1951-02-04 Today's Date: 04/01/2012 Time: 9629-5284 PT Time Calculation (min): 29 min  PT Assessment / Plan / Recommendation Comments on Treatment Session  increased distance. No dizziness during mobility. possible DC 2/15 if Hgb is stable.    Follow Up Recommendations  Home health PT     Does the patient have the potential to tolerate intense rehabilitation     Barriers to Discharge        Equipment Recommendations  Rolling walker with 5" wheels    Recommendations for Other Services    Frequency 7X/week   Plan Discharge plan remains appropriate;Frequency remains appropriate    Precautions / Restrictions Precautions Precautions: Knee   Pertinent Vitals/Pain Sore R knee.    Mobility  Bed Mobility Supine to Sit: 4: Min assist Sit to Supine: 4: Min guard Details for Bed Mobility Assistance: support for RLE off bed./onto bed using sheet.  Transfers Sit to Stand: 6: Modified independent (Device/Increase time);With upper extremity assist;From bed Stand to Sit: 6: Modified independent (Device/Increase time);With upper extremity assist;To bed Ambulation/Gait Ambulation/Gait Assistance: 5: Supervision Ambulation Distance (Feet): 200 Feet Assistive device: Rolling walker Gait Pattern: Step-through pattern;Decreased step length - right    Exercises 10 reps of quad sets,SLR,heel slides,abd, on R le.     PT Diagnosis:    PT Problem List:   PT Treatment Interventions:     PT Goals Acute Rehab PT Goals Pt will go Supine/Side to Sit: with supervision PT Goal: Supine/Side to Sit - Progress: Progressing toward goal Pt will go Sit to Supine/Side: with supervision PT Goal: Sit to Supine/Side - Progress: Progressing toward goal Pt will go Sit to Stand: with supervision PT Goal: Sit to Stand - Progress: Met Pt will go Stand to Sit: with supervision PT Goal: Stand to Sit - Progress: Met Pt  will Ambulate: with supervision;with rolling walker;>150 feet PT Goal: Ambulate - Progress: Updated due to goal met  Visit Information  Last PT Received On: 04/01/12 Assistance Needed: +1    Subjective Data  Subjective: I am doing good. Getting OOB is the hardest.   Cognition  Cognition Overall Cognitive Status: Appears within functional limits for tasks assessed/performed    Balance     End of Session PT - End of Session Activity Tolerance: Patient tolerated treatment well Patient left: in bed;with call bell/phone within reach Nurse Communication: Mobility status   GP     [name] 04/01/2012, 4:33 PM

## 2012-04-01 NOTE — Care Management Note (Signed)
    Page 1 of 2   04/02/2012     11:31:36 AM   CARE MANAGEMENT NOTE 04/02/2012  Patient:  Patricia Krueger, Patricia Krueger   Account Number:  [id number]  Date Initiated:  04/01/2012  Documentation initiated by:  Colleen Can  Subjective/Objective Assessment:   dx osteoarthritis right knee: total knee replacemnt/.    Pre-arranged with Genevieve Norlander prior to admission     Action/Plan:   CM spoke with patient. Plans are for her to return to her home in [facility] where her sister will be caregiver. She will need 3n1 and RW. Wants HH agency in network   Anticipated DC Date:  04/02/2012   Anticipated DC Plan:  HOME W HOME HEALTH SERVICES  In-house referral  NA      DC Planning Services  CM consult      [facility] Choice  HOME HEALTH  DURABLE MEDICAL EQUIPMENT   Choice offered to / List presented to:  C-1 Patient   DME arranged  3-N-1  Levan Hurst      DME agency  [facility]     HH arranged  HH-2 PT      [facility] agency  [facility]   Status of service:  Completed, signed off Medicare Important Message given?  NO (If response is "NO", the following Medicare IM given date fields will be blank) Date Medicare IM given:   Date Additional Medicare IM given:    Discharge Disposition:  HOME W HOME HEALTH SERVICES  Per UR Regulation:    If discussed at Long Length of Stay Meetings, dates discussed:    Comments:  04/02/12 Alessia Gonsalez RN,BSN NCM [phone] HHPT ORDERED.SPOKE TO [place] REP TEL#[phone] ALREADY HAS [place] OFFICE FOLLOWING.HAS ALL INFO NEEDED FOR SOC DAY AFTER D/C.[facility] DME REP HAS ORDERS & WILL DELIVER RW,3N1 TO RM.  04/01/2012 Colleen Can BSN RN CCM [phone] Genevieve Norlander will provide HHpt services. Services will begin day after patient is discharged.

## 2012-04-01 NOTE — Progress Notes (Signed)
   Subjective: 2 Days Post-Op Procedure(s) (LRB): TOTAL KNEE ARTHROPLASTY (Right) Patient reports pain as mild.   Patient seen in rounds without Dr. Darrelyn Hillock. Patient is well, but has had some minor complaints of fever and nausea and constipation. She reports that therapy has gone well, walking to the end of the hall. No issues overnight. She denies shortness of breath and chest pain. She reports that the pain from the knee is well-controlled with medication. She is having some discomfort from her IV site when she walks with her walker.  Plan is to go Home after hospital stay.  Objective: Vital signs in last 24 hours: Temp:  [97.1 F (36.2 C)-98.7 F (37.1 C)] 98.7 F (37.1 C) (02/14 0455) Pulse Rate:  [58-107] 58 (02/14 0455) Resp:  [16] 16 (02/14 0455) BP: (106-138)/(67-84) 138/84 mmHg (02/14 0455) SpO2:  [93 %-100 %] 93 % (02/14 0455)  Intake/Output from previous day:  Intake/Output Summary (Last 24 hours) at 04/01/12 1051 Last data filed at 04/01/12 0700  Gross per 24 hour  Intake    860 ml  Output   3000 ml  Net  -2140 ml     Labs:  Recent Labs  03/31/12 0420 04/01/12 0407  HGB 10.2* 8.6*    Recent Labs  03/31/12 0420 04/01/12 0407  WBC 5.3 6.9  RBC 3.43* 2.87*  HCT 31.6* 26.2*  PLT 184 153    Recent Labs  03/31/12 0420 04/01/12 0407  NA 136 134*  K 3.1* 3.2*  CL 100 101  CO2 27 26  BUN 11 11  CREATININE 0.85 0.89  GLUCOSE 123* 109*  CALCIUM 8.3* 8.3*    EXAM General - Patient is Alert and Oriented Extremity - Neurologically intact Neurovascular intact Dorsiflexion/Plantar flexion intact Dressing/Incision - clean, dry, no drainage Motor Function - intact, moving foot and toes well on exam.   Past Medical History  Diagnosis Date  . Acute meniscal tear of knee RIGHT  . Hypertension   . Left leg weakness DUE TO LYMPHEMEMA    WEARS BRACE  . Lymphedema of left leg   . Pneumonia     hx of pneumonia x 2   . Lymphedema     left leg - due to  lymphnodes removed from groin in 1987  . UTI (lower urinary tract infection)   . Headache     hx of migraines   . Arthritis   . Anemia     Assessment/Plan: 2 Days Post-Op Procedure(s) (LRB): TOTAL KNEE ARTHROPLASTY (Right) Active Problems:   Osteoarthritis of right knee   Acute blood loss anemia  Estimated body mass index is 35.36 kg/(m^2) as calculated from the following:   Height as of this encounter: 5\' 6"  (1.676 m).   Weight as of this encounter: 99.338 kg (219 lb). Advance diet Up with therapy D/C IV fluids Plan for discharge tomorrow  DVT Prophylaxis - Xarelto Weight-Bearing as tolerated to right leg  She is progressing well. Will have her start on Miralax today to try to relieve constipation. Will recheck Hgb this afternoon. If Hgb stable we will DC IV. Will hep lock this morning.  If Hgb lower than 8.0 will transfuse 2 units. Plan for discharge home tomorrow with home health.   Teran Daughenbaugh LAUREN 04/01/2012, 10:51 AM

## 2012-04-02 LAB — CBC
MCH: 30 pg (ref 26.0–34.0)
MCHC: 33.1 g/dL (ref 30.0–36.0)
Platelets: 153 10*3/uL (ref 150–400)
RBC: 2.7 MIL/uL — ABNORMAL LOW (ref 3.87–5.11)

## 2012-04-02 NOTE — Progress Notes (Signed)
Physical Therapy Treatment Patient Details Name: Patricia Krueger MRN: [id number] DOB: Sep 29, 1950 Today's Date: 04/02/2012 Time: 4098-1191 PT Time Calculation (min): 26 min  PT Assessment / Plan / Recommendation Comments on Treatment Session  Pt premedicated for therapy.  Pt ambulated in hallway and practiced 1 step as well as performed exercises.  Pt reported feeling weak and no dizziness with mobility however did report slight dizziness with exercises.  Pt to d/c home today and had no further questions.    Follow Up Recommendations  Home health PT     Does the patient have the potential to tolerate intense rehabilitation     Barriers to Discharge        Equipment Recommendations  Other (comment) (RW and BSC delivered and in pt room)    Recommendations for Other Services    Frequency     Plan Frequency remains appropriate;Discharge plan remains appropriate    Precautions / Restrictions Precautions Precautions: Knee Restrictions Weight Bearing Restrictions: No   Pertinent Vitals/Pain     Mobility  Bed Mobility Bed Mobility: Supine to Sit Supine to Sit: HOB elevated;5: Supervision Details for Bed Mobility Assistance: educated pt in use of sheet to assist R LE off bed, increased time but no assist from therapist required Transfers Transfers: Stand to Sit;Sit to Stand Sit to Stand: 5: Supervision;From chair/3-in-1;From bed;With upper extremity assist Stand to Sit: 5: Supervision;To chair/3-in-1;With upper extremity assist Details for Transfer Assistance: verbal cues for R LE forward and hand placement to push up Ambulation/Gait Ambulation/Gait Assistance: 5: Supervision Ambulation Distance (Feet): 60 Feet Assistive device: Rolling walker Ambulation/Gait Assistance Details: verbal cues for posture, ambulated again without KI Gait Pattern: Step-through pattern;Decreased step length - right;Trunk flexed Gait velocity: decreased Stairs: Yes Stairs Assistance: 4: Min  guard Stairs Assistance Details (indicate cue type and reason): verbal cues for sequence, RW placement, safety Stair Management Technique: Step to pattern;Forwards;With walker Number of Stairs: 1    Exercises Total Joint Exercises Quad Sets: AROM;Right;15 reps Short Arc QuadBarbaraann Boys;Right;10 reps Heel Slides: AAROM;Right;10 reps Hip ABduction/ADduction: AAROM;Right;15 reps Straight Leg Raises: AAROM;Right;10 reps   PT Diagnosis:    PT Problem List:   PT Treatment Interventions:     PT Goals Acute Rehab PT Goals PT Goal: Supine/Side to Sit - Progress: Met PT Goal: Sit to Stand - Progress: Met PT Goal: Stand to Sit - Progress: Met PT Goal: Ambulate - Progress: Progressing toward goal PT Goal: Up/Down Stairs - Progress: Met PT Goal: Perform Home Exercise Program - Progress: Progressing toward goal  Visit Information  Last PT Received On: 04/02/12 Assistance Needed: +1    Subjective Data  Subjective: Just feeling real weak today.   Cognition  Cognition Overall Cognitive Status: Appears within functional limits for tasks assessed/performed Arousal/Alertness: Awake/alert Orientation Level: Appears intact for tasks assessed Behavior During Session: [name] for tasks performed    Balance     End of Session PT - End of Session Activity Tolerance: Patient tolerated treatment well Patient left: in chair;with call bell/phone within reach   GP     [name],[name] 04/02/2012, 12:05 PM [name], PT, DPT 04/02/2012 Pager: [phone]

## 2012-04-04 NOTE — Discharge Summary (Signed)
Physician Discharge Summary   Patient ID: Patricia Krueger MRN: [id number] DOB/AGE: [birth year] 62 y.o.  Admit date: 03/30/2012 Discharge date: 04/02/2012  Primary Diagnosis: Osteoarthritis, right knee   Admission Diagnoses:  Past Medical History  Diagnosis Date  . Acute meniscal tear of knee RIGHT  . Hypertension   . Left leg weakness DUE TO LYMPHEMEMA    WEARS BRACE  . Lymphedema of left leg   . Pneumonia     hx of pneumonia x 2   . Lymphedema     left leg - due to lymphnodes removed from groin in 1987  . UTI (lower urinary tract infection)   . Headache     hx of migraines   . Arthritis   . Anemia    Discharge Diagnoses:   Active Problems:   Osteoarthritis of right knee   Acute blood loss anemia S/P right total knee arthroplasty  Estimated body mass index is 35.36 kg/(m^2) as calculated from the following:   Height as of this encounter: 5\' 6"  (1.676 m).   Weight as of this encounter: 99.338 kg (219 lb).  Classification of overweight in adults according to BMI (WHO, 1998)   Procedure:  Procedure(s) (LRB): TOTAL KNEE ARTHROPLASTY (Right)   Consults: None  HPI: Patricia Krueger, 62 y.o. female, has a history of pain and functional disability in the right knee due to arthritis and has failed non-surgical conservative treatments for greater than 12 weeks to includeNSAID's and/or analgesics, corticosteriod injections, viscosupplementation injections and activity modification. Onset of symptoms was gradual, starting 1 years ago with gradually worsening course since that time. The patient noted prior procedures on the knee to include arthroscopy and menisectomy on the right knee(s). Patient currently rates pain in the right knee(s) at 7 out of 10 with activity. Patient has night pain, worsening of pain with activity and weight bearing, pain that interferes with activities of daily living, pain with passive range of motion, crepitus and joint swelling. Patient has evidence of  subchondral cysts and joint space narrowing by imaging studies  Laboratory Data: Admission on 03/30/2012, Discharged on 04/02/2012  Component Date Value Range Status  . ABO/RH(D) 03/30/2012 A POS   Final  . Antibody Screen 03/30/2012 NEG   Final  . Sample Expiration 03/30/2012 04/02/2012   Final  . ABO/RH(D) 03/30/2012 A POS   Final  . WBC 03/31/2012 5.3  4.0 - 10.5 K/uL Final  . RBC 03/31/2012 3.43* 3.87 - 5.11 MIL/uL Final  . Hemoglobin 03/31/2012 10.2* 12.0 - 15.0 g/dL Final  . HCT 40/98/1191 31.6* 36.0 - 46.0 % Final  . MCV 03/31/2012 92.1  78.0 - 100.0 fL Final  . MCH 03/31/2012 29.7  26.0 - 34.0 pg Final  . MCHC 03/31/2012 32.3  30.0 - 36.0 g/dL Final  . RDW 47/82/9562 12.3  11.5 - 15.5 % Final  . Platelets 03/31/2012 184  150 - 400 K/uL Final  . Sodium 03/31/2012 136  135 - 145 mEq/L Final  . Potassium 03/31/2012 3.1* 3.5 - 5.1 mEq/L Final  . Chloride 03/31/2012 100  96 - 112 mEq/L Final  . CO2 03/31/2012 27  19 - 32 mEq/L Final  . Glucose, Bld 03/31/2012 123* 70 - 99 mg/dL Final  . BUN 13/09/6576 11  6 - 23 mg/dL Final  . Creatinine, Ser 03/31/2012 0.85  0.50 - 1.10 mg/dL Final  . Calcium 46/96/2952 8.3* 8.4 - 10.5 mg/dL Final  . GFR calc non Af Amer 03/31/2012 74* >90 mL/min Final  .  GFR calc Af Amer 03/31/2012 85* >90 mL/min Final   Comment:                                 The eGFR has been calculated                          using the CKD EPI equation.                          This calculation has not been                          validated in all clinical                          situations.                          eGFR's persistently                          <90 mL/min signify                          possible Chronic Kidney Disease.  . WBC 04/01/2012 6.9  4.0 - 10.5 K/uL Final  . RBC 04/01/2012 2.87* 3.87 - 5.11 MIL/uL Final  . Hemoglobin 04/01/2012 8.6* 12.0 - 15.0 g/dL Final  . HCT 16/11/9602 26.2* 36.0 - 46.0 % Final  . MCV 04/01/2012 91.3  78.0 - 100.0 fL  Final  . MCH 04/01/2012 30.0  26.0 - 34.0 pg Final  . MCHC 04/01/2012 32.8  30.0 - 36.0 g/dL Final  . RDW 54/10/8117 12.2  11.5 - 15.5 % Final  . Platelets 04/01/2012 153  150 - 400 K/uL Final  . Sodium 04/01/2012 134* 135 - 145 mEq/L Final  . Potassium 04/01/2012 3.2* 3.5 - 5.1 mEq/L Final  . Chloride 04/01/2012 101  96 - 112 mEq/L Final  . CO2 04/01/2012 26  19 - 32 mEq/L Final  . Glucose, Bld 04/01/2012 109* 70 - 99 mg/dL Final  . BUN 14/78/2956 11  6 - 23 mg/dL Final  . Creatinine, Ser 04/01/2012 0.89  0.50 - 1.10 mg/dL Final  . Calcium 21/30/8657 8.3* 8.4 - 10.5 mg/dL Final  . GFR calc non Af Amer 04/01/2012 70* >90 mL/min Final  . GFR calc Af Amer 04/01/2012 81* >90 mL/min Final   Comment:                                 The eGFR has been calculated                          using the CKD EPI equation.                          This calculation has not been                          validated in all clinical  situations.                          eGFR's persistently                          <90 mL/min signify                          possible Chronic Kidney Disease.  Marland Kitchen Hemoglobin 04/01/2012 8.9* 12.0 - 15.0 g/dL Final  . HCT 21/30/8657 26.9* 36.0 - 46.0 % Final  . WBC 04/02/2012 6.7  4.0 - 10.5 K/uL Final  . RBC 04/02/2012 2.70* 3.87 - 5.11 MIL/uL Final  . Hemoglobin 04/02/2012 8.1* 12.0 - 15.0 g/dL Final  . HCT 84/69/6295 24.5* 36.0 - 46.0 % Final  . MCV 04/02/2012 90.7  78.0 - 100.0 fL Final  . MCH 04/02/2012 30.0  26.0 - 34.0 pg Final  . MCHC 04/02/2012 33.1  30.0 - 36.0 g/dL Final  . RDW 28/41/3244 12.5  11.5 - 15.5 % Final  . Platelets 04/02/2012 153  150 - 400 K/uL Final  Hospital Outpatient Visit on 03/23/2012  Component Date Value Range Status  . aPTT 03/23/2012 29  24 - 37 seconds Final  . Sodium 03/23/2012 136  135 - 145 mEq/L Final  . Potassium 03/23/2012 3.4* 3.5 - 5.1 mEq/L Final  . Chloride 03/23/2012 101  96 - 112 mEq/L Final  . CO2  03/23/2012 26  19 - 32 mEq/L Final  . Glucose, Bld 03/23/2012 90  70 - 99 mg/dL Final  . BUN 02/18/7251 12  6 - 23 mg/dL Final  . Creatinine, Ser 03/23/2012 0.85  0.50 - 1.10 mg/dL Final  . Calcium 66/44/0347 9.4  8.4 - 10.5 mg/dL Final  . Total Protein 03/23/2012 7.2  6.0 - 8.3 g/dL Final  . Albumin 42/59/5638 3.4* 3.5 - 5.2 g/dL Final  . AST 75/64/3329 14  0 - 37 U/L Final  . ALT 03/23/2012 7  0 - 35 U/L Final  . Alkaline Phosphatase 03/23/2012 45  39 - 117 U/L Final  . Total Bilirubin 03/23/2012 0.2* 0.3 - 1.2 mg/dL Final  . GFR calc non Af Amer 03/23/2012 74* >90 mL/min Final  . GFR calc Af Amer 03/23/2012 85* >90 mL/min Final   Comment:                                 The eGFR has been calculated                          using the CKD EPI equation.                          This calculation has not been                          validated in all clinical                          situations.                          eGFR's persistently                          <  90 mL/min signify                          possible Chronic Kidney Disease.  Marland Kitchen Prothrombin Time 03/23/2012 12.4  11.6 - 15.2 seconds Final  . INR 03/23/2012 0.93  0.00 - 1.49 Final  . Color, Urine 03/23/2012 YELLOW  YELLOW Final  . APPearance 03/23/2012 CLEAR  CLEAR Final  . Specific Gravity, Urine 03/23/2012 1.015  1.005 - 1.030 Final  . pH 03/23/2012 5.0  5.0 - 8.0 Final  . Glucose, UA 03/23/2012 NEGATIVE  NEGATIVE mg/dL Final  . Hgb urine dipstick 03/23/2012 TRACE* NEGATIVE Final  . Bilirubin Urine 03/23/2012 NEGATIVE  NEGATIVE Final  . Ketones, ur 03/23/2012 NEGATIVE  NEGATIVE mg/dL Final  . Protein, ur 16/11/9602 NEGATIVE  NEGATIVE mg/dL Final  . Urobilinogen, UA 03/23/2012 0.2  0.0 - 1.0 mg/dL Final  . Nitrite 54/10/8117 NEGATIVE  NEGATIVE Final  . Leukocytes, UA 03/23/2012 NEGATIVE  NEGATIVE Final  . MRSA, PCR 03/23/2012 NEGATIVE  NEGATIVE Final  . Staphylococcus aureus 03/23/2012 NEGATIVE  NEGATIVE Final    Comment:                                 The Xpert SA Assay (FDA                          approved for NASAL specimens                          in patients over 6 years of age),                          is one component of                          a comprehensive surveillance                          program.  Test performance has                          been validated by [employer] for patients greater                          than or equal to 62 year old.                          It is not intended                          to diagnose infection nor to                          guide or monitor treatment.  . WBC 03/23/2012 4.6  4.0 - 10.5 K/uL Final  . RBC 03/23/2012 4.15  3.87 - 5.11 MIL/uL Final  . Hemoglobin 03/23/2012 12.6  12.0 - 15.0 g/dL Final  . HCT 14/78/2956 37.3  36.0 - 46.0 % Final  .  MCV 03/23/2012 89.9  78.0 - 100.0 fL Final  . MCH 03/23/2012 30.4  26.0 - 34.0 pg Final  . MCHC 03/23/2012 33.8  30.0 - 36.0 g/dL Final  . RDW 16/11/9602 12.0  11.5 - 15.5 % Final  . Platelets 03/23/2012 237  150 - 400 K/uL Final  . Squamous Epithelial / LPF 03/23/2012 RARE  RARE Final  . RBC / HPF 03/23/2012 0-2  <3 RBC/hpf Final     X-Rays:Dg Chest 2 View  03/23/2012  *RADIOLOGY REPORT*  Clinical Data: Preoperative respiratory evaluation.  CHEST - 2 VIEW  Comparison: CT scan from 04/04/2008 at [facility].  Findings: The lungs are clear without focal consolidation, edema, effusion or pneumothorax.  Cardiopericardial silhouette is within normal limits for size.  Imaged bony structures of the thorax are intact.  IMPRESSION: Normal exam.   Original Report Authenticated By: Kennith Center, M.D.    Dg Knee Right Port  03/30/2012  *RADIOLOGY REPORT*  Clinical Data: Pain  PORTABLE RIGHT KNEE - 1-2 VIEW  Comparison: None.  Findings: Patient is status post left total knee arthroplasty. Satisfactory position and alignment.  Surgical drain good position.  IMPRESSION: As  above.   Original Report Authenticated By: Davonna Belling, M.D.     EKG: Orders placed during the hospital encounter of 03/23/12  . EKG 12-LEAD  . EKG 12-LEAD     Hospital Course: STAPHANIE HARBISON is a 62 y.o. who was admitted to [facility]. They were brought to the operating room on 03/30/2012 and underwent Procedure(s): TOTAL KNEE ARTHROPLASTY.  Patient tolerated the procedure well and was later transferred to the recovery room and then to the orthopaedic floor for postoperative care.  They were given PO and IV analgesics for pain control following their surgery.  They were given 24 hours of postoperative antibiotics of  Anti-infectives   Start     Dose/Rate Route Frequency Ordered Stop   03/30/12 1430  ceFAZolin (ANCEF) IVPB 1 g/50 mL premix     1 g 100 mL/hr over 30 Minutes Intravenous Every 6 hours 03/30/12 1416 03/30/12 2119   03/30/12 0900  polymyxin B 500,000 Units, bacitracin 50,000 Units in sodium chloride irrigation 0.9 % 500 mL irrigation  Status:  Discontinued       As needed 03/30/12 0900 03/30/12 1039   03/30/12 0717  ceFAZolin (ANCEF) IVPB 2 g/50 mL premix     2 g 100 mL/hr over 30 Minutes Intravenous 60 min pre-op 03/30/12 0717 03/30/12 0831     and started on DVT prophylaxis in the form of Xarelto.   PT and OT were ordered for total joint protocol.  Discharge planning consulted to help with postop disposition and equipment needs.  Patient had a rough night on the evening of surgery due to pain but started to get up OOB with therapy on day one. Hemovac drain was pulled without difficulty.  Continued to work with therapy into day two.  Dressing was changed on day two and the incision was clean and dry.  By day three, the patient had progressed with therapy and meeting their goals. Hgb stabilized. Incision was healing well.  Patient was seen in rounds and was ready to go home.   Discharge Medications: Prior to Admission medications   Medication Sig Start Date End Date  Taking? Authorizing Provider  acetaminophen (TYLENOL) 500 MG tablet Take 500 mg by mouth every 6 (six) hours as needed. For pain   Yes Historical Provider, MD  atenolol (TENORMIN) 50 MG  tablet Take 50 mg by mouth every evening.   Yes Historical Provider, MD  estradiol (ESTRACE) 2 MG tablet Take 2 mg by mouth daily.   Yes Historical Provider, MD  hydrochlorothiazide (HYDRODIURIL) 25 MG tablet Take 25 mg by mouth every morning.    Yes Historical Provider, MD  OVER THE COUNTER MEDICATION Gummy fiber - one daily  Patient takes Fiberwell brand   Yes Historical Provider, MD  potassium chloride (KLOR-CON) 20 MEQ packet Take 20 mEq by mouth 2 (two) times daily.   Yes Historical Provider, MD  trolamine salicylate (ASPERCREME) 10 % cream Apply 1 application topically daily as needed. For neck and shoulder pain   Yes Historical Provider, MD  bisacodyl (DULCOLAX) 10 MG suppository Place 1 suppository (10 mg total) rectally daily as needed. 04/01/12   Alexzavier Girardin Tamala Ser, PA  ferrous sulfate 325 (65 FE) MG tablet Take 1 tablet (325 mg total) by mouth 3 (three) times daily after meals. 04/01/12   Johnryan Sao Tamala Ser, PA  methocarbamol (ROBAXIN) 500 MG tablet Take 1 tablet (500 mg total) by mouth 3 (three) times daily. 04/01/12   Gazella Anglin Tamala Ser, PA  oxyCODONE-acetaminophen (PERCOCET/ROXICET) 5-325 MG per tablet Take 1-2 tablets by mouth every 4 (four) hours as needed. 04/01/12   Kinza Gouveia Tamala Ser, PA  polyethylene glycol (MIRALAX / GLYCOLAX) packet Take 17 g by mouth daily as needed. 04/01/12   Torren Maffeo Tamala Ser, PA  rivaroxaban (XARELTO) 10 MG TABS tablet Take 1 tablet (10 mg total) by mouth daily with breakfast. 04/01/12   Kerby Nora, PA    Diet: Cardiac diet Activity:WBAT Follow-up:in 2 weeks Disposition - Home Discharged Condition: fair   Discharge Orders   Future Orders Complete By Expires     Call MD / Call 911  As directed     Comments:      If you experience chest  pain or shortness of breath, CALL 911 and be transported to the hospital emergency room.  If you develope a fever above 101 F, pus (white drainage) or increased drainage or redness at the wound, or calf pain, call your surgeon's office.    Call MD / Call 911  As directed     Comments:      If you experience chest pain or shortness of breath, CALL 911 and be transported to the hospital emergency room.  If you develope a fever above 101 F, pus (white drainage) or increased drainage or redness at the wound, or calf pain, call your surgeon's office.    Constipation Prevention  As directed     Comments:      Drink plenty of fluids.  Prune juice may be helpful.  You may use a stool softener, such as Colace (over the counter) 100 mg twice a day.  Use MiraLax (over the counter) for constipation as needed.    Constipation Prevention  As directed     Comments:      Drink plenty of fluids.  Prune juice may be helpful.  You may use a stool softener, such as Colace (over the counter) 100 mg twice a day.  Use MiraLax (over the counter) for constipation as needed.    Diet - low sodium heart healthy  As directed     Diet general  As directed     Discharge instructions  As directed     Comments:      Walk with your walker. Weight bearing as instructed. Home Health Agency will follow you at home  for your therapy Change your dressing daily. Shower only, no tub bath. Call if any temperatures greater than 101 or any wound complications: [phone] during the day and ask for Dr. Jeannetta Ellis nurse, [facility]. Follow up in office in 2 weeks    Do not put a pillow under the knee. Place it under the heel.  As directed     Driving restrictions  As directed     Comments:      No driving    Increase activity slowly as tolerated  As directed     Increase activity slowly as tolerated  As directed         Medication List    STOP taking these medications       aspirin EC 81 MG tablet     celecoxib 200 MG capsule    Commonly known as:  CELEBREX     multivitamin tablet      TAKE these medications       acetaminophen 500 MG tablet  Commonly known as:  TYLENOL  Take 500 mg by mouth every 6 (six) hours as needed. For pain     atenolol 50 MG tablet  Commonly known as:  TENORMIN  Take 50 mg by mouth every evening.     bisacodyl 10 MG suppository  Commonly known as:  DULCOLAX  Place 1 suppository (10 mg total) rectally daily as needed.     estradiol 2 MG tablet  Commonly known as:  ESTRACE  Take 2 mg by mouth daily.     ferrous sulfate 325 (65 FE) MG tablet  Take 1 tablet (325 mg total) by mouth 3 (three) times daily after meals.     hydrochlorothiazide 25 MG tablet  Commonly known as:  HYDRODIURIL  Take 25 mg by mouth every morning.     methocarbamol 500 MG tablet  Commonly known as:  ROBAXIN  Take 1 tablet (500 mg total) by mouth 3 (three) times daily.     OVER THE COUNTER MEDICATION  Gummy fiber - one daily   Patient takes Fiberwell brand     oxyCODONE-acetaminophen 5-325 MG per tablet  Commonly known as:  PERCOCET/ROXICET  Take 1-2 tablets by mouth every 4 (four) hours as needed.     polyethylene glycol packet  Commonly known as:  MIRALAX / GLYCOLAX  Take 17 g by mouth daily as needed.     potassium chloride 20 MEQ packet  Commonly known as:  KLOR-CON  Take 20 mEq by mouth 2 (two) times daily.     rivaroxaban 10 MG Tabs tablet  Commonly known as:  XARELTO  Take 1 tablet (10 mg total) by mouth daily with breakfast.     trolamine salicylate 10 % cream  Commonly known as:  ASPERCREME  Apply 1 application topically daily as needed. For neck and shoulder pain           Follow-up Information   Follow up with GIOFFRE,RONALD A, MD. Call in 2 weeks.   Contact information:   [address], [address] [place] [address] [place] [address] [phone]       Signed: Kerby Nora 04/04/2012, 3:19 PM

## 2012-11-07 ENCOUNTER — Ambulatory Visit: Payer: BC Managed Care – PPO | Admitting: Pharmacist Clinician (PhC)/ Clinical Pharmacy Specialist

## 2012-11-07 ENCOUNTER — Other Ambulatory Visit (HOSPITAL_COMMUNITY): Payer: Self-pay | Admitting: Orthopedic Surgery

## 2012-11-07 ENCOUNTER — Ambulatory Visit (HOSPITAL_COMMUNITY)
Admission: RE | Admit: 2012-11-07 | Discharge: 2012-11-07 | Disposition: A | Discharge status: Home / Self Care | Payer: BC Managed Care – PPO | Source: Ambulatory Visit | Attending: Cardiovascular Disease | Admitting: Cardiovascular Disease

## 2012-11-07 DIAGNOSIS — M79609 Pain in unspecified limb: Secondary | ICD-10-CM

## 2012-11-07 DIAGNOSIS — M7989 Other specified soft tissue disorders: Secondary | ICD-10-CM

## 2012-11-07 DIAGNOSIS — I82401 Acute embolism and thrombosis of unspecified deep veins of right lower extremity: Secondary | ICD-10-CM

## 2012-11-07 DIAGNOSIS — I82409 Acute embolism and thrombosis of unspecified deep veins of unspecified lower extremity: Secondary | ICD-10-CM | POA: Insufficient documentation

## 2012-11-07 NOTE — Progress Notes (Signed)
Right Lower Ext. Venous Duplex Completed. Negative for DVT. Merilee Wible, BS, RDMS, RVT  

## 2014-01-19 ENCOUNTER — Ambulatory Visit: Payer: BC Managed Care – PPO

## 2015-12-05 ENCOUNTER — Emergency Department (HOSPITAL_COMMUNITY)
Admission: EM | Admit: 2015-12-05 | Discharge: 2015-12-05 | Disposition: A | Discharge status: Home / Self Care | Payer: BLUE CROSS/BLUE SHIELD | Attending: Emergency Medicine | Admitting: Emergency Medicine

## 2015-12-05 ENCOUNTER — Encounter (HOSPITAL_COMMUNITY): Payer: Self-pay | Admitting: *Deleted

## 2015-12-05 ENCOUNTER — Emergency Department (HOSPITAL_COMMUNITY): Payer: BLUE CROSS/BLUE SHIELD

## 2015-12-05 DIAGNOSIS — I10 Essential (primary) hypertension: Secondary | ICD-10-CM | POA: Diagnosis not present

## 2015-12-05 DIAGNOSIS — R1031 Right lower quadrant pain: Secondary | ICD-10-CM | POA: Diagnosis present

## 2015-12-05 DIAGNOSIS — Z79899 Other long term (current) drug therapy: Secondary | ICD-10-CM | POA: Insufficient documentation

## 2015-12-05 DIAGNOSIS — Z96651 Presence of right artificial knee joint: Secondary | ICD-10-CM | POA: Insufficient documentation

## 2015-12-05 DIAGNOSIS — Z7901 Long term (current) use of anticoagulants: Secondary | ICD-10-CM | POA: Diagnosis not present

## 2015-12-05 LAB — URINALYSIS, ROUTINE W REFLEX MICROSCOPIC
Bilirubin Urine: NEGATIVE
GLUCOSE, UA: NEGATIVE mg/dL
KETONES UR: NEGATIVE mg/dL
LEUKOCYTES UA: NEGATIVE
NITRITE: NEGATIVE
Protein, ur: NEGATIVE mg/dL
Specific Gravity, Urine: 1.015 (ref 1.005–1.030)
pH: 7.5 (ref 5.0–8.0)

## 2015-12-05 LAB — CBC
HCT: 36.6 % (ref 36.0–46.0)
Hemoglobin: 12.4 g/dL (ref 12.0–15.0)
MCH: 30.3 pg (ref 26.0–34.0)
MCHC: 33.9 g/dL (ref 30.0–36.0)
MCV: 89.5 fL (ref 78.0–100.0)
PLATELETS: 254 10*3/uL (ref 150–400)
RBC: 4.09 MIL/uL (ref 3.87–5.11)
RDW: 12.4 % (ref 11.5–15.5)
WBC: 4.2 10*3/uL (ref 4.0–10.5)

## 2015-12-05 LAB — COMPREHENSIVE METABOLIC PANEL
ALT: 8 U/L — ABNORMAL LOW (ref 14–54)
AST: 15 U/L (ref 15–41)
Albumin: 3.7 g/dL (ref 3.5–5.0)
Alkaline Phosphatase: 41 U/L (ref 38–126)
Anion gap: 10 (ref 5–15)
BILIRUBIN TOTAL: 0.6 mg/dL (ref 0.3–1.2)
BUN: 7 mg/dL (ref 6–20)
CO2: 23 mmol/L (ref 22–32)
CREATININE: 1 mg/dL (ref 0.44–1.00)
Calcium: 9.8 mg/dL (ref 8.9–10.3)
Chloride: 104 mmol/L (ref 101–111)
GFR calc Af Amer: >60 mL/min (ref >60)
GFR, EST NON AFRICAN AMERICAN: 59 mL/min — AB (ref >60)
GLUCOSE: 101 mg/dL — AB (ref 65–99)
Potassium: 3.4 mmol/L — ABNORMAL LOW (ref 3.5–5.1)
Sodium: 137 mmol/L (ref 135–145)
TOTAL PROTEIN: 7.4 g/dL (ref 6.5–8.1)

## 2015-12-05 LAB — URINE MICROSCOPIC-ADD ON

## 2015-12-05 LAB — LIPASE, BLOOD: Lipase: 16 U/L (ref 11–51)

## 2015-12-05 MED ORDER — TRAMADOL HCL 50 MG PO TABS: 50.0000 mg | ORAL_TABLET | Freq: Four times a day (QID) | ORAL | 0 refills | Status: AC | PRN

## 2015-12-05 MED ORDER — IOPAMIDOL (ISOVUE-300) INJECTION 61%
INTRAVENOUS | Status: AC
  Filled 2015-12-05: qty 100

## 2015-12-05 MED ORDER — HYDROMORPHONE HCL 2 MG/ML IJ SOLN
0.5000 mg | Freq: Once | INTRAMUSCULAR | Status: AC
Start: 2015-12-05 — End: 2015-12-05
  Administered 2015-12-05: 0.5 mg via INTRAVENOUS
  Filled 2015-12-05: qty 1

## 2015-12-05 MED ORDER — IOPAMIDOL (ISOVUE-300) INJECTION 61%
INTRAVENOUS | Status: AC
  Administered 2015-12-05: 100 mL
  Filled 2015-12-05: qty 100

## 2015-12-05 MED ORDER — ONDANSETRON HCL 4 MG/2ML IJ SOLN
4.0000 mg | Freq: Once | INTRAMUSCULAR | Status: AC
  Administered 2015-12-05: 4 mg via INTRAVENOUS
  Filled 2015-12-05: qty 2

## 2015-12-05 MED ORDER — DIPHENHYDRAMINE HCL 50 MG/ML IJ SOLN
25.0000 mg | Freq: Once | INTRAMUSCULAR | Status: AC
  Administered 2015-12-05: 25 mg via INTRAVENOUS
  Filled 2015-12-05: qty 1

## 2015-12-05 NOTE — ED Triage Notes (Signed)
Pt c/o right lower abdominal pain for a couple of weeks. Pt was seen by PCP, instructed to come to ED for further eval. Pt reports hernia in same spot, reports lifting heavy suit case a couple of weeks ago. Pt reports some nausea, denies v/d.

## 2015-12-05 NOTE — ED Notes (Signed)
Dr. Glick at bedside.  

## 2015-12-05 NOTE — ED Provider Notes (Signed)
[facility]-EMERGENCY DEPT Provider Note   CSN: [id number] Arrival date & time: 12/05/15  1505     History   Chief Complaint Chief Complaint  Patient presents with  . Abdominal Pain    HPI Patricia Krueger is a 65 y.o. female.  She has a history of hypertension. For the last 3 weeks, she has been having pain in the right lower abdomen which is common worse over the last week. Pain is worse when sitting and better when laying flat. She rates pain at 8/10. There is associated nausea but no vomiting. She has been having intermittent diarrhea. She denies any urinary urgency, frequency, tenesmus, dysuria. There have been no fever or chills. She has taken over-the-counter medication without relief. She does have history of hernia surgery in the same area but has not noted any bulge.   The history is provided by the patient.  Abdominal Pain      Past Medical History:  Diagnosis Date  . Acute meniscal tear of knee RIGHT  . Anemia   . Arthritis   . Headache(784.0)    hx of migraines   . Hypertension   . Left leg weakness DUE TO LYMPHEMEMA   WEARS BRACE  . Lymphedema    left leg - due to lymphnodes removed from groin in 1987  . Lymphedema of left leg   . Pneumonia    hx of pneumonia x 2   . UTI (lower urinary tract infection)     Patient Active Problem List   Diagnosis Date Noted  . Acute blood loss anemia 03/31/2012    Class: Acute  . Osteoarthritis of right knee 03/30/2012    Past Surgical History:  Procedure Laterality Date  . BENIGN BREAST TUMORS REMOVED  1984  . BILATERAL SALPINGOOPHORECTOMY  2000  . blood clot removed      right arm   . CHONDROPLASTY  04/07/2011   Procedure: CHONDROPLASTY;  Surgeon: Jacki Cones, MD;  Location: [facility];  Service: Orthopedics;;  chondroplasty of right medial tibial plateau and patella  . COLECTOMY FOR PERFERATION  2008  . CYST REMOVED LEFT MIDDLE FINGER  1985  . EXPL. RIGHT THUMB/ REMOVAL SCARRED PORTION OF A1  PULLEY  02-18-2009  . INGUINAL HERNIA REPAIR  OCT 2009   RIGHT  . LATERAL RECTUS MUSCLE RECESSION, BOTH EYES  08-15-2010  . LEFT INGUINAL LYMPH NODE REMOVED   1987   LYMPHEDEMA  . TOTAL KNEE ARTHROPLASTY Right 03/30/2012   Procedure: TOTAL KNEE ARTHROPLASTY;  Surgeon: Jacki Cones, MD;  Location: [facility];  Service: Orthopedics;  Laterality: Right;  . TUBAL LIGATION  YRS AGO  . VAGINAL HYSTERECTOMY  1997    OB History    No data available       Home Medications    Prior to Admission medications   Medication Sig Start Date End Date Taking? Authorizing Provider  acetaminophen (TYLENOL) 500 MG tablet Take 500 mg by mouth every 6 (six) hours as needed. For pain    Historical Provider, MD  atenolol (TENORMIN) 50 MG tablet Take 50 mg by mouth every evening.    Historical Provider, MD  bisacodyl (DULCOLAX) 10 MG suppository Place 1 suppository (10 mg total) rectally daily as needed. 04/01/12   Amber Celedonio Savage, PA-C  estradiol (ESTRACE) 2 MG tablet Take 2 mg by mouth daily.    Historical Provider, MD  ferrous sulfate 325 (65 FE) MG tablet Take 1 tablet (325 mg total) by mouth 3 (three) times daily  after meals. 04/01/12   Amber Celedonio Savage, PA-C  hydrochlorothiazide (HYDRODIURIL) 25 MG tablet Take 25 mg by mouth every morning.     Historical Provider, MD  methocarbamol (ROBAXIN) 500 MG tablet Take 1 tablet (500 mg total) by mouth 3 (three) times daily. 04/01/12   Amber Constable, PA-C  OVER THE COUNTER MEDICATION Gummy fiber - one daily  Patient takes Fiberwell brand    Historical Provider, MD  oxyCODONE-acetaminophen (PERCOCET/ROXICET) 5-325 MG per tablet Take 1-2 tablets by mouth every 4 (four) hours as needed. 04/01/12   Amber Constable, PA-C  polyethylene glycol (MIRALAX / GLYCOLAX) packet Take 17 g by mouth daily as needed. 04/01/12   Amber Constable, PA-C  potassium chloride (KLOR-CON) 20 MEQ packet Take 20 mEq by mouth 2 (two) times daily.    Historical Provider, MD  rivaroxaban (XARELTO)  10 MG TABS tablet Take 1 tablet (10 mg total) by mouth daily with breakfast. 04/01/12   Dimitri Ped, PA-C  trolamine salicylate (ASPERCREME) 10 % cream Apply 1 application topically daily as needed. For neck and shoulder pain    Historical Provider, MD    Family History History reviewed. No pertinent family history.  Social History Social History  Substance Use Topics  . Smoking status: Never Smoker  . Smokeless tobacco: Never Used  . Alcohol use Yes     Comment: occasional     Allergies   Morphine and related   Review of Systems Review of Systems  Gastrointestinal: Positive for abdominal pain.  All other systems reviewed and are negative.    Physical Exam Updated Vital Signs BP 150/76   Pulse 64   Temp 97.9 F (36.6 C) (Oral)   Resp 20   SpO2 98%   Physical Exam  Nursing note and vitals reviewed.  65 year old female, resting comfortably and in no acute distress. Vital signs are Significant for hypertension. Oxygen saturation is 98%, which is normal. Head is normocephalic and atraumatic. PERRLA, EOMI. Oropharynx is clear. Neck is nontender and supple without adenopathy or JVD. Back is nontender and there is no CVA tenderness. Lungs are clear without rales, wheezes, or rhonchi. Chest is nontender. Heart has regular rate and rhythm without murmur. Abdomen is soft, flat, with very localized tenderness in the right lower quadrant in McBurney's area. There is no rebound or guarding. There are no masses or hepatosplenomegaly and peristalsis is normoactive. Extremities have no cyanosis or edema, full range of motion is present. Skin is warm and dry without rash. Neurologic: Mental status is normal, cranial nerves are intact, there are no motor or sensory deficits.  ED Treatments / Results  Labs (all labs ordered are listed, but only abnormal results are displayed) Labs Reviewed  COMPREHENSIVE METABOLIC PANEL - Abnormal; Notable for the following:       Result  Value   Potassium 3.4 (*)    Glucose, Bld 101 (*)    ALT 8 (*)    GFR calc non Af Amer 59 (*)    All other components within normal limits  URINALYSIS, ROUTINE W REFLEX MICROSCOPIC (NOT AT [facility]) - Abnormal; Notable for the following:    Hgb urine dipstick TRACE (*)    All other components within normal limits  URINE MICROSCOPIC-ADD ON - Abnormal; Notable for the following:    Squamous Epithelial / LPF 0-5 (*)    Bacteria, UA RARE (*)    All other components within normal limits  LIPASE, BLOOD  CBC    EKG  EKG Interpretation None  Radiology Ct Abdomen Pelvis W Contrast  Result Date: 12/05/2015 CLINICAL DATA:  Subacute onset of right lower quadrant abdominal pain and nausea. Initial encounter. EXAM: CT ABDOMEN AND PELVIS WITH CONTRAST TECHNIQUE: Multidetector CT imaging of the abdomen and pelvis was performed using the standard protocol following bolus administration of intravenous contrast. CONTRAST:  ISOVUE-300 IOPAMIDOL (ISOVUE-300) INJECTION 61% COMPARISON:  CT of the abdomen and pelvis performed 04/04/2008 FINDINGS: Lower chest: The visualized lung bases are grossly clear. The visualized portions of the mediastinum are unremarkable. Hepatobiliary: Scattered calcified granulomata are noted within the liver. The gallbladder is unremarkable in appearance. The common bile duct remains normal in caliber. Pancreas:  The pancreas is within normal limits. Spleen: A few calcified granulomata are seen within the spleen. The spleen is otherwise unremarkable. Adrenals/Urinary Tract: The adrenal glands are unremarkable in appearance. The kidneys are within normal limits. There is no evidence of hydronephrosis. No renal or ureteral stones are identified. No perinephric stranding is seen. Stomach/Bowel: The stomach is unremarkable in appearance. The small bowel is within normal limits. The appendix is normal in caliber, without evidence of appendicitis. The colon is unremarkable in  appearance. A bowel suture line is noted at the mid sigmoid colon. 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 Vascular/Lymphatic: The abdominal aorta is unremarkable in appearance. The inferior vena cava is grossly unremarkable. No retroperitoneal lymphadenopathy is seen. No pelvic sidewall lymphadenopathy is identified. Reproductive: The bladder is mildly distended and grossly unremarkable. The patient is status post hysterectomy. No suspicious adnexal masses are seen. Other: No additional soft tissue abnormalities are seen. Postoperative change is noted at the left inguinal region. Musculoskeletal: No acute osseous abnormalities are identified. The visualized musculature is unremarkable in appearance. IMPRESSION: 1. No definite acute abnormality seen to explain the patient's symptoms. 2. 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 Electronically Signed   By: Roanna Raider M.D.   On: 12/05/2015 20:41    Procedures Procedures (including critical care time)  Medications Ordered in ED Medications  iopamidol (ISOVUE-300) 61 % injection (not administered)  ondansetron (ZOFRAN) injection 4 mg (4 mg Intravenous Given 12/05/15 1851)  HYDROmorphone (DILAUDID) injection 0.5 mg (0.5 mg Intravenous Given 12/05/15 1852)  diphenhydrAMINE (BENADRYL) injection 25 mg (25 mg Intravenous Given 12/05/15 1849)  iopamidol (ISOVUE-300) 61 % injection (100 mLs  Contrast Given 12/05/15 1946)     Initial Impression / Assessment and Plan / ED Course  I have reviewed the triage vital signs and the nursing notes.  Pertinent labs & imaging results that were available during my care of the patient were reviewed by me and considered in my medical decision making (see chart for details).  Clinical Course   Right lower quadrant pain. Consider appendicitis, diverticulitis, urinary tract infection, recurrent hernia. WBC is  normal on that, with chronicity of complaints, there are certainly could be an encapsulated periappendiceal abscess. She will be sent for CT of abdomen and pelvis. Old records are reviewed, and she has no relevant past visits.  CT shows no acute process. Patient states that she had similar pain following her hernia repair and was treated with some local injections. She is advised to see her surgeon to see if that might be helpful. In the meantime, she is given a prescription for tramadol for pain. She declined prescriptions for any stronger narcotics.  Final Clinical Impressions(s) / ED Diagnoses   Final diagnoses:  Right lower  quadrant pain    New Prescriptions New Prescriptions   TRAMADOL (ULTRAM) 50 MG TABLET    Take 1 tablet (50 mg total) by mouth every 6 (six) hours as needed.     Dione Boozeavid Tyniah Kastens, MD 12/05/15 2126

## 2015-12-05 NOTE — Discharge Instructions (Signed)
Return if pain is getting worse, you start running a fever, or if you start vomiting. °

## 2017-09-28 IMAGING — CT CT ABD-PELV W/ CM
2 of 5 series · 15 of 46 positions shown, 17 images · IV contrast (iopamidol)
Comparison: CT of the abdomen and pelvis performed 04/04/2008

CLINICAL DATA: Subacute onset of right lower quadrant abdominal
pain and nausea. Initial encounter.

EXAM:
CT ABDOMEN AND PELVIS WITH CONTRAST
TECHNIQUE: Multidetector CT imaging of the abdomen and pelvis was performed
using the standard protocol following bolus administration of
intravenous contrast.
CONTRAST:  100mL IHDD0D-TTT IOPAMIDOL (IHDD0D-TTT) INJECTION 61%

[Series 2: abd/ pelvis 5.0 i30f 1 · axial · 0.68mm/px · z∈[+916,+1316]mm · 12 of 91 slices shown, 14 images]
[im 6/91  soft-tissue]
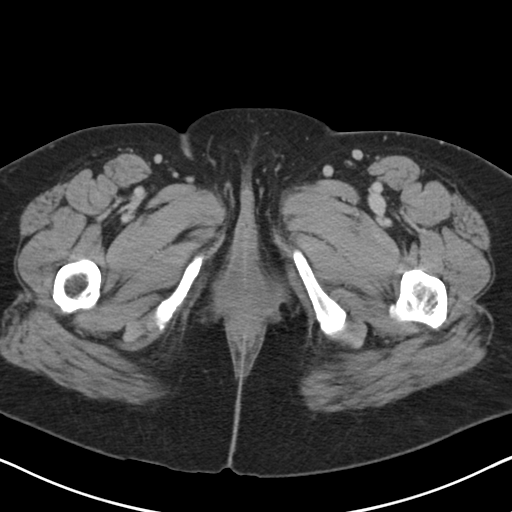
[im 6/91  bone]
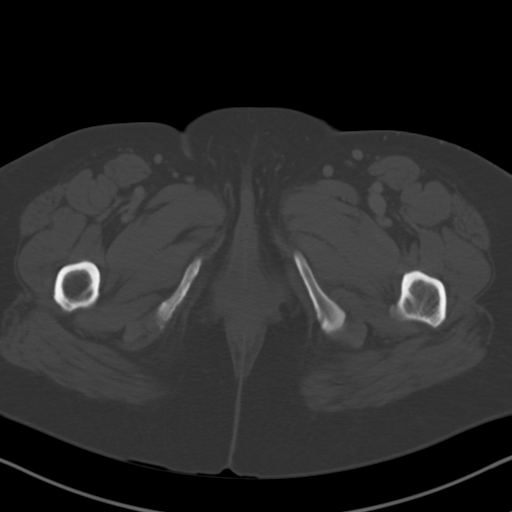
[im 16/91  soft-tissue]
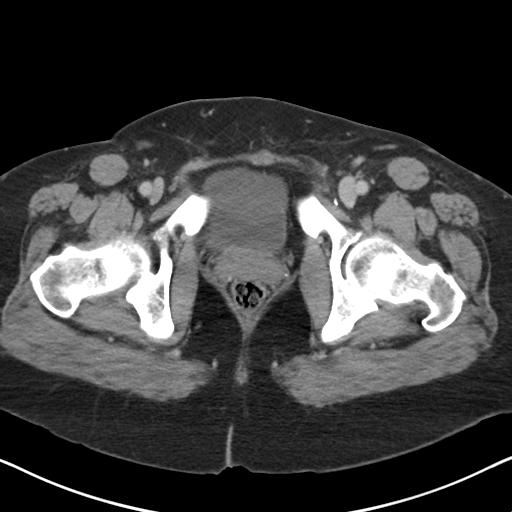
[im 21/91  soft-tissue]
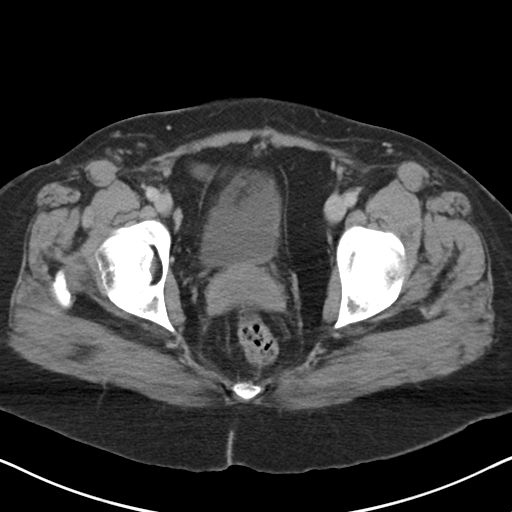
[im 26/91  soft-tissue]
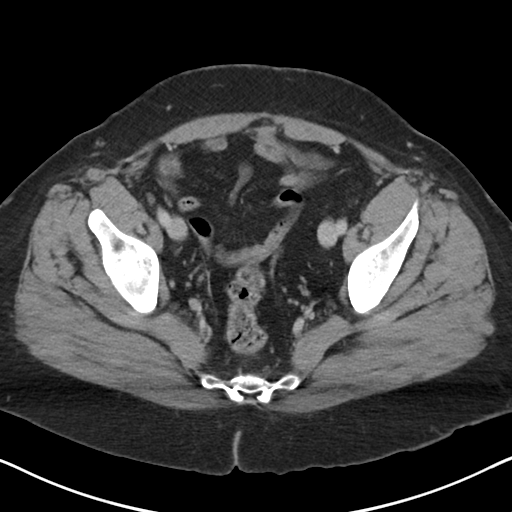
[im 36/91  soft-tissue]
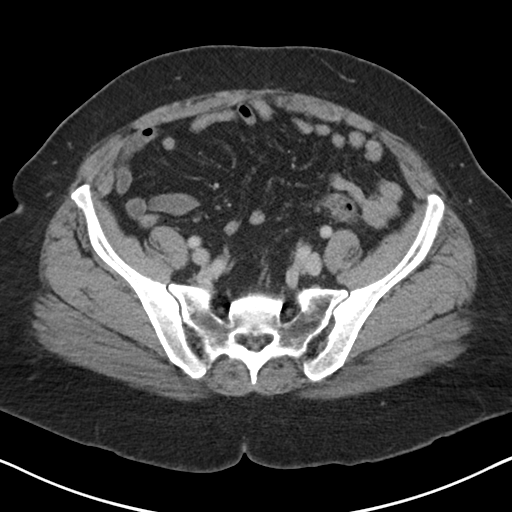
[im 41/91  soft-tissue]
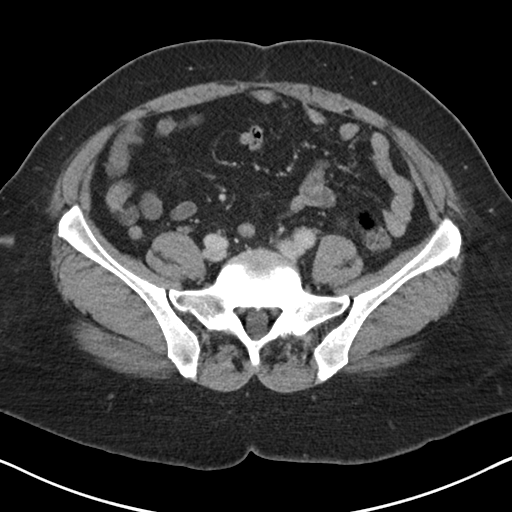
[im 51/91  soft-tissue]
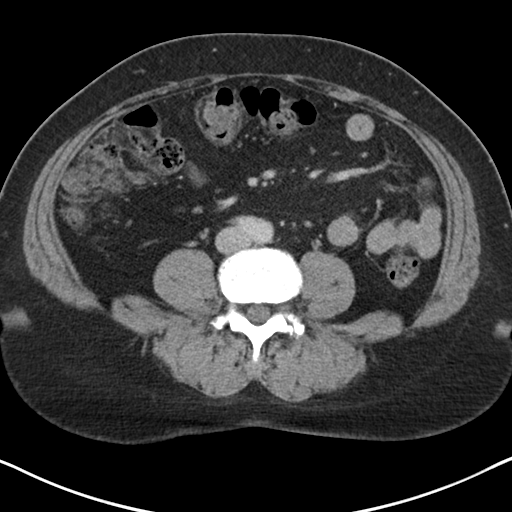
[im 56/91  soft-tissue]
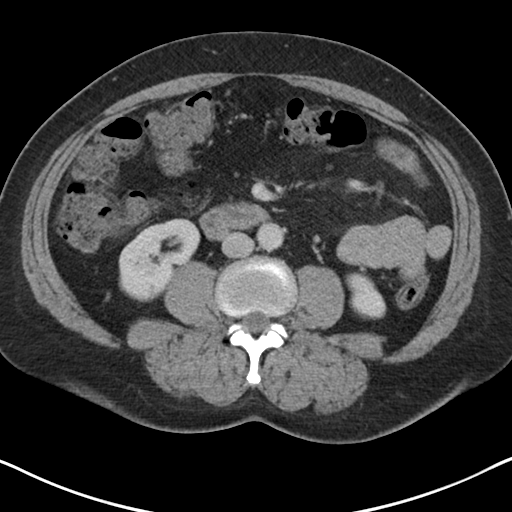
[im 66/91  soft-tissue]
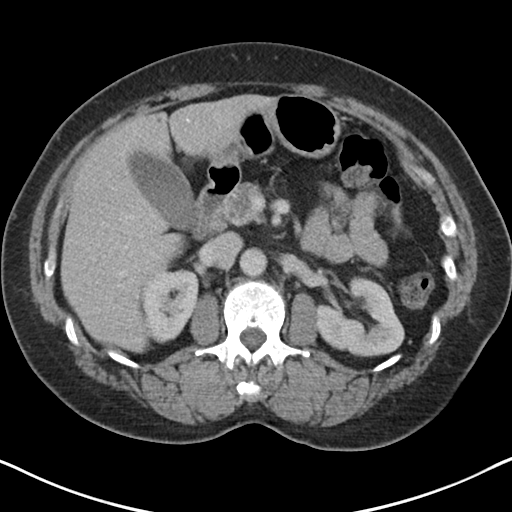
[im 66/91  bone]
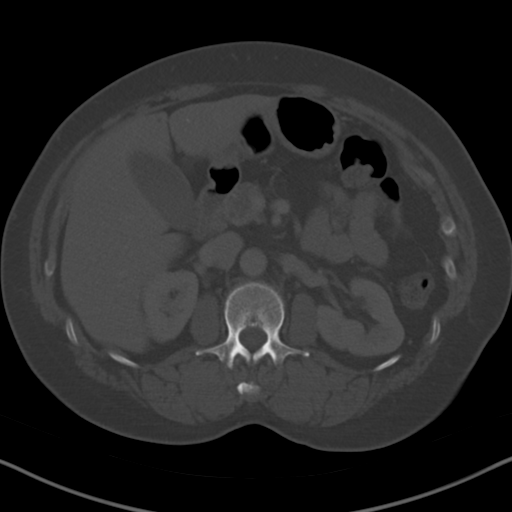
[im 71/91  soft-tissue]
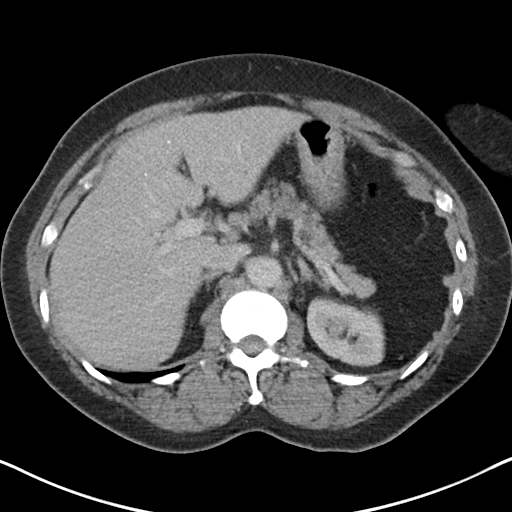
[im 76/91  soft-tissue]
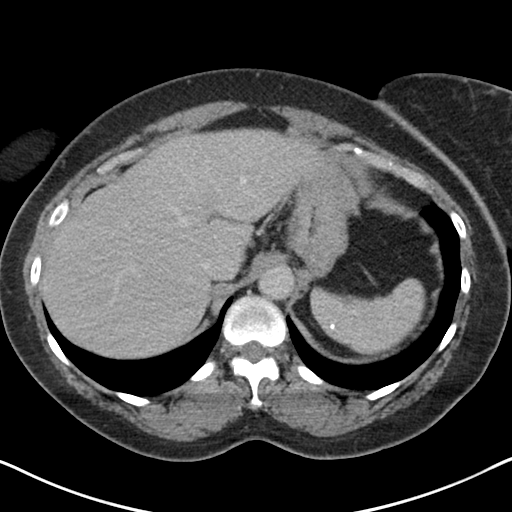
[im 86/91  soft-tissue]
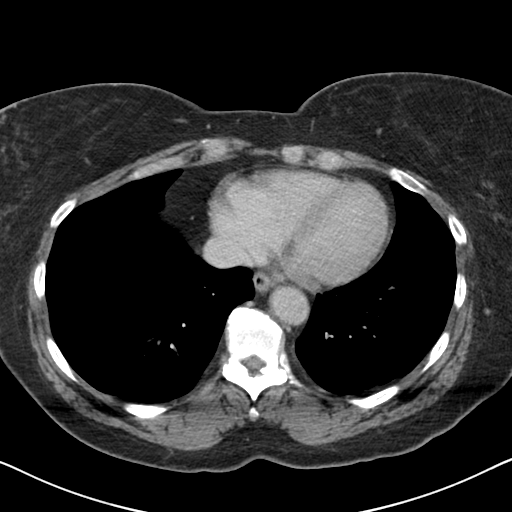

[Series 5: coronal soft tissue · coronal · 0.69mm/px · 3 of 101 slices shown]
[im 34/101  soft-tissue]
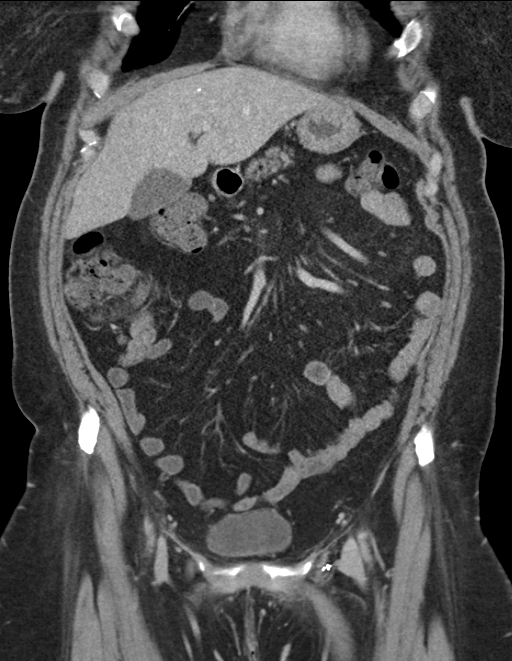
[im 45/101  soft-tissue]
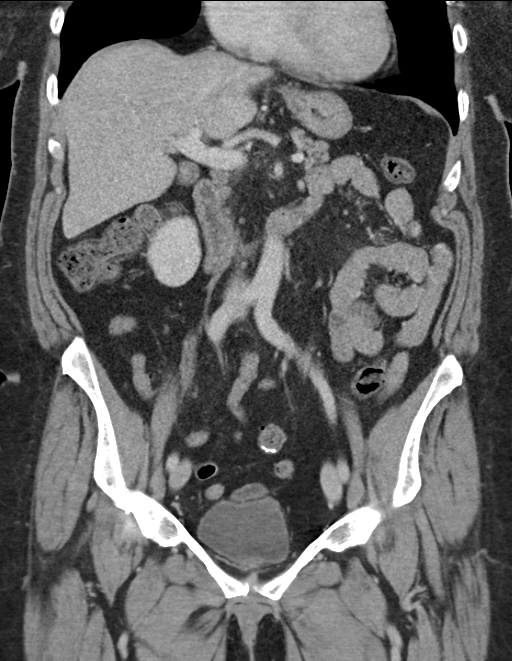
[im 56/101  soft-tissue]
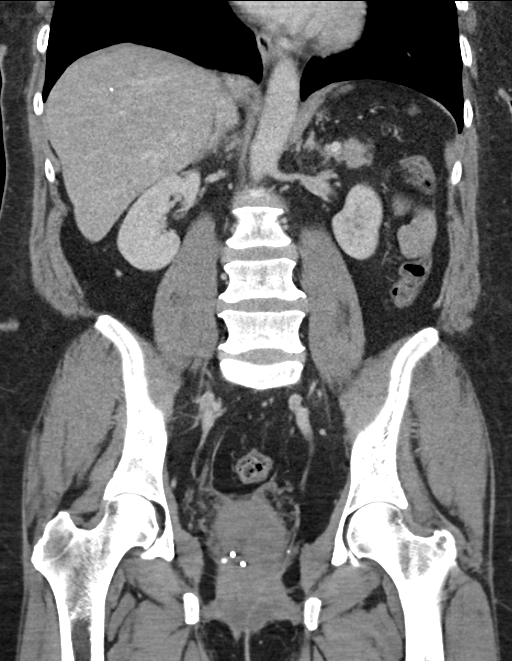

[15 of 46 positions shown; findings below may reference images not displayed]

FINDINGS: Lower chest: The visualized lung bases are grossly clear. The
visualized portions of the mediastinum are unremarkable.

Hepatobiliary: Scattered calcified granulomata are noted within the
liver. The gallbladder is unremarkable in appearance. The common
bile duct remains normal in caliber.

Pancreas:  The pancreas is within normal limits.

Spleen: A few calcified granulomata are seen within the spleen. The
spleen is otherwise unremarkable.

Adrenals/Urinary Tract: The adrenal glands are unremarkable in
appearance. The kidneys are within normal limits. There is no
evidence of hydronephrosis. No renal or ureteral stones are
identified. No perinephric stranding is seen.

Stomach/Bowel: The stomach is unremarkable in appearance. The small
bowel is within normal limits. The appendix is normal in caliber,
without evidence of appendicitis. The colon is unremarkable in
appearance. A bowel suture line is noted at the mid sigmoid colon.

Mild mesenteric inflammation is noted at the left mid abdomen. This
is chronic and stable from 1646.

Vascular/Lymphatic: The abdominal aorta is unremarkable in
appearance. The inferior vena cava is grossly unremarkable. No
retroperitoneal lymphadenopathy is seen. No pelvic sidewall
lymphadenopathy is identified.

Reproductive: The bladder is mildly distended and grossly
unremarkable. The patient is status post hysterectomy. No suspicious
adnexal masses are seen.

Other: No additional soft tissue abnormalities are seen.
Postoperative change is noted at the left inguinal region.

Musculoskeletal: No acute osseous abnormalities are identified. The
visualized musculature is unremarkable in appearance.
IMPRESSION: 1. No definite acute abnormality seen to explain the patient's
symptoms.
2. Mild chronic mesenteric inflammation noted at the left mid
abdomen, stable from 1646.

## 2019-04-20 ENCOUNTER — Ambulatory Visit: Payer: BLUE CROSS/BLUE SHIELD | Attending: Internal Medicine

## 2021-07-03 ENCOUNTER — Ambulatory Visit: Payer: Self-pay | Admitting: Family

## 2021-07-16 ENCOUNTER — Ambulatory Visit: Payer: Self-pay | Admitting: Family

## 2023-01-21 ENCOUNTER — Other Ambulatory Visit: Payer: Self-pay | Admitting: Otolaryngology

## 2023-01-21 DIAGNOSIS — R1314 Dysphagia, pharyngoesophageal phase: Secondary | ICD-10-CM

## 2023-01-21 DIAGNOSIS — E042 Nontoxic multinodular goiter: Secondary | ICD-10-CM

## 2023-01-21 DIAGNOSIS — E049 Nontoxic goiter, unspecified: Secondary | ICD-10-CM

## 2023-01-25 ENCOUNTER — Other Ambulatory Visit: Payer: Self-pay | Admitting: Otolaryngology

## 2023-01-25 DIAGNOSIS — E049 Nontoxic goiter, unspecified: Secondary | ICD-10-CM

## 2023-01-25 DIAGNOSIS — E042 Nontoxic multinodular goiter: Secondary | ICD-10-CM

## 2023-01-26 ENCOUNTER — Ambulatory Visit
Admission: RE | Admit: 2023-01-26 | Discharge: 2023-01-26 | Disposition: A | Discharge status: Home / Self Care | Payer: Medicare HMO | Source: Ambulatory Visit | Attending: Otolaryngology

## 2023-01-26 DIAGNOSIS — E042 Nontoxic multinodular goiter: Secondary | ICD-10-CM

## 2023-01-26 DIAGNOSIS — E049 Nontoxic goiter, unspecified: Secondary | ICD-10-CM

## 2023-01-28 ENCOUNTER — Other Ambulatory Visit: Payer: BLUE CROSS/BLUE SHIELD

## 2023-10-21 ENCOUNTER — Other Ambulatory Visit (HOSPITAL_COMMUNITY): Payer: Self-pay | Admitting: Otolaryngology

## 2023-10-21 DIAGNOSIS — E042 Nontoxic multinodular goiter: Secondary | ICD-10-CM

## 2023-10-21 DIAGNOSIS — E049 Nontoxic goiter, unspecified: Secondary | ICD-10-CM

## 2023-11-04 ENCOUNTER — Encounter (HOSPITAL_BASED_OUTPATIENT_CLINIC_OR_DEPARTMENT_OTHER): Payer: Self-pay

## 2023-11-04 ENCOUNTER — Inpatient Hospital Stay (HOSPITAL_BASED_OUTPATIENT_CLINIC_OR_DEPARTMENT_OTHER): Admission: RE | Admit: 2023-11-04 | Source: Ambulatory Visit | Admitting: Radiology

## 2023-12-09 ENCOUNTER — Ambulatory Visit (HOSPITAL_BASED_OUTPATIENT_CLINIC_OR_DEPARTMENT_OTHER)
Admission: RE | Admit: 2023-12-09 | Discharge: 2023-12-09 | Disposition: A | Discharge status: Home / Self Care | Source: Ambulatory Visit | Attending: Otolaryngology | Admitting: Otolaryngology

## 2023-12-09 DIAGNOSIS — E049 Nontoxic goiter, unspecified: Secondary | ICD-10-CM | POA: Insufficient documentation

## 2023-12-09 DIAGNOSIS — E042 Nontoxic multinodular goiter: Secondary | ICD-10-CM | POA: Diagnosis present

## 2023-12-22 ENCOUNTER — Other Ambulatory Visit (HOSPITAL_COMMUNITY): Payer: Self-pay | Admitting: Otolaryngology

## 2023-12-22 DIAGNOSIS — E042 Nontoxic multinodular goiter: Secondary | ICD-10-CM

## 2024-01-28 ENCOUNTER — Inpatient Hospital Stay (HOSPITAL_BASED_OUTPATIENT_CLINIC_OR_DEPARTMENT_OTHER): Admission: RE | Admit: 2024-01-28 | Source: Ambulatory Visit | Admitting: Radiology
